# Patient Record
Sex: Female | Born: 1950 | Race: Black or African American | Hispanic: No | Smoking: Former smoker
Health system: Southern US, Community
[De-identification: ages and names within clinical notes are randomized; demographics above are authoritative.]

## PROBLEM LIST (undated history)

## (undated) DIAGNOSIS — E785 Hyperlipidemia, unspecified: Secondary | ICD-10-CM

## (undated) DIAGNOSIS — M1990 Unspecified osteoarthritis, unspecified site: Secondary | ICD-10-CM

## (undated) DIAGNOSIS — E119 Type 2 diabetes mellitus without complications: Secondary | ICD-10-CM

## (undated) DIAGNOSIS — I10 Essential (primary) hypertension: Secondary | ICD-10-CM

## (undated) DIAGNOSIS — Z9109 Other allergy status, other than to drugs and biological substances: Secondary | ICD-10-CM

## (undated) HISTORY — PX: TONSILLECTOMY: SUR1361

## (undated) HISTORY — PX: TUBAL LIGATION: SHX77

---

## 1978-05-23 HISTORY — PX: OTHER SURGICAL HISTORY: SHX169

## 2002-05-23 HISTORY — PX: HAND SURGERY: SHX662

## 2008-01-10 ENCOUNTER — Encounter: Admission: RE | Admit: 2008-01-10 | Discharge: 2008-01-10 | Payer: Self-pay | Admitting: Occupational Medicine

## 2009-06-04 ENCOUNTER — Emergency Department (HOSPITAL_BASED_OUTPATIENT_CLINIC_OR_DEPARTMENT_OTHER): Admission: EM | Admit: 2009-06-04 | Discharge: 2009-06-05 | Payer: Self-pay | Admitting: Emergency Medicine

## 2009-06-23 ENCOUNTER — Emergency Department (HOSPITAL_BASED_OUTPATIENT_CLINIC_OR_DEPARTMENT_OTHER): Admission: EM | Admit: 2009-06-23 | Discharge: 2009-06-24 | Payer: Self-pay | Admitting: Emergency Medicine

## 2009-06-23 ENCOUNTER — Ambulatory Visit: Payer: Self-pay | Admitting: Diagnostic Radiology

## 2009-12-23 ENCOUNTER — Emergency Department (HOSPITAL_BASED_OUTPATIENT_CLINIC_OR_DEPARTMENT_OTHER): Admission: EM | Admit: 2009-12-23 | Discharge: 2009-12-23 | Payer: Self-pay | Admitting: Emergency Medicine

## 2009-12-23 ENCOUNTER — Ambulatory Visit: Payer: Self-pay | Admitting: Diagnostic Radiology

## 2010-05-17 ENCOUNTER — Emergency Department (HOSPITAL_BASED_OUTPATIENT_CLINIC_OR_DEPARTMENT_OTHER)
Admission: EM | Admit: 2010-05-17 | Discharge: 2010-05-17 | Payer: Self-pay | Source: Home / Self Care | Admitting: Emergency Medicine

## 2010-06-13 ENCOUNTER — Emergency Department (HOSPITAL_BASED_OUTPATIENT_CLINIC_OR_DEPARTMENT_OTHER)
Admission: EM | Admit: 2010-06-13 | Discharge: 2010-06-13 | Payer: Self-pay | Source: Home / Self Care | Admitting: Emergency Medicine

## 2010-10-16 ENCOUNTER — Emergency Department (INDEPENDENT_AMBULATORY_CARE_PROVIDER_SITE_OTHER): Payer: Worker's Compensation

## 2010-10-16 ENCOUNTER — Emergency Department (HOSPITAL_BASED_OUTPATIENT_CLINIC_OR_DEPARTMENT_OTHER)
Admission: EM | Admit: 2010-10-16 | Discharge: 2010-10-16 | Disposition: A | Discharge status: Home / Self Care | Payer: Worker's Compensation | Attending: Emergency Medicine | Admitting: Emergency Medicine

## 2010-10-16 DIAGNOSIS — I10 Essential (primary) hypertension: Secondary | ICD-10-CM | POA: Insufficient documentation

## 2010-10-16 DIAGNOSIS — S5000XA Contusion of unspecified elbow, initial encounter: Secondary | ICD-10-CM

## 2010-10-16 DIAGNOSIS — W208XXA Other cause of strike by thrown, projected or falling object, initial encounter: Secondary | ICD-10-CM

## 2010-10-16 DIAGNOSIS — Y9289 Other specified places as the place of occurrence of the external cause: Secondary | ICD-10-CM | POA: Insufficient documentation

## 2010-10-16 DIAGNOSIS — Y9269 Other specified industrial and construction area as the place of occurrence of the external cause: Secondary | ICD-10-CM

## 2010-10-24 ENCOUNTER — Emergency Department (HOSPITAL_BASED_OUTPATIENT_CLINIC_OR_DEPARTMENT_OTHER)
Admission: EM | Admit: 2010-10-24 | Discharge: 2010-10-25 | Disposition: A | Discharge status: Home / Self Care | Payer: Worker's Compensation | Attending: Emergency Medicine | Admitting: Emergency Medicine

## 2010-10-24 DIAGNOSIS — W208XXA Other cause of strike by thrown, projected or falling object, initial encounter: Secondary | ICD-10-CM | POA: Insufficient documentation

## 2010-10-24 DIAGNOSIS — I10 Essential (primary) hypertension: Secondary | ICD-10-CM | POA: Insufficient documentation

## 2010-10-24 DIAGNOSIS — S5000XA Contusion of unspecified elbow, initial encounter: Secondary | ICD-10-CM | POA: Insufficient documentation

## 2010-11-27 ENCOUNTER — Encounter: Payer: Self-pay | Admitting: *Deleted

## 2010-11-27 ENCOUNTER — Emergency Department (HOSPITAL_BASED_OUTPATIENT_CLINIC_OR_DEPARTMENT_OTHER)
Admission: EM | Admit: 2010-11-27 | Discharge: 2010-11-28 | Disposition: A | Discharge status: Home / Self Care | Payer: PRIVATE HEALTH INSURANCE | Attending: Emergency Medicine | Admitting: Emergency Medicine

## 2010-11-27 DIAGNOSIS — R51 Headache: Secondary | ICD-10-CM | POA: Insufficient documentation

## 2010-11-27 DIAGNOSIS — J3489 Other specified disorders of nose and nasal sinuses: Secondary | ICD-10-CM | POA: Insufficient documentation

## 2010-11-27 DIAGNOSIS — R300 Dysuria: Secondary | ICD-10-CM | POA: Insufficient documentation

## 2010-11-27 DIAGNOSIS — B9789 Other viral agents as the cause of diseases classified elsewhere: Secondary | ICD-10-CM | POA: Insufficient documentation

## 2010-11-27 HISTORY — DX: Essential (primary) hypertension: I10

## 2010-11-27 NOTE — ED Notes (Signed)
Pt reports generalized malaise and body aches as well as HA denies fever

## 2010-11-28 ENCOUNTER — Encounter (HOSPITAL_BASED_OUTPATIENT_CLINIC_OR_DEPARTMENT_OTHER): Payer: Self-pay | Admitting: Emergency Medicine

## 2010-11-28 LAB — CBC
Hemoglobin: 12 g/dL (ref 12.0–15.0)
MCH: 26.5 pg (ref 26.0–34.0)
MCV: 79.7 fL (ref 78.0–100.0)
RBC: 4.53 MIL/uL (ref 3.87–5.11)

## 2010-11-28 LAB — URINALYSIS, ROUTINE W REFLEX MICROSCOPIC
Hgb urine dipstick: NEGATIVE
Ketones, ur: NEGATIVE mg/dL
Leukocytes, UA: NEGATIVE
Nitrite: NEGATIVE
Urobilinogen, UA: 1 mg/dL (ref 0.0–1.0)

## 2010-11-28 LAB — DIFFERENTIAL
Basophils Relative: 0 % (ref 0–1)
Eosinophils Relative: 4 % (ref 0–5)
Lymphocytes Relative: 55 % — ABNORMAL HIGH (ref 12–46)
Neutrophils Relative %: 32 % — ABNORMAL LOW (ref 43–77)

## 2010-11-28 LAB — BASIC METABOLIC PANEL
GFR calc Af Amer: >60 mL/min (ref >60)
GFR calc non Af Amer: >60 mL/min (ref >60)
Potassium: 3.3 mEq/L — ABNORMAL LOW (ref 3.5–5.1)

## 2010-11-28 NOTE — ED Provider Notes (Signed)
History     Chief Complaint  Patient presents with  . Headache  . Generalized Body Aches   Patient is a 60 y.o. female presenting with flu symptoms. The history is provided by the patient.  Influenza Episode onset: 4 days ago. The problem has not changed since onset.Associated symptoms include headaches. Pertinent negatives include no chest pain, no abdominal pain and no shortness of breath. The symptoms are aggravated by nothing. The symptoms are relieved by nothing.    Past Medical History  Diagnosis Date  . Hypertension     Past Surgical History  Procedure Date  . Tonsillectomy   . Hand surgery 2004    History reviewed. No pertinent family history.  History  Substance Use Topics  . Smoking status: Current Everyday Smoker    Types: Cigarettes  . Smokeless tobacco: Not on file  . Alcohol Use: No    OB History    Grav Para Term Preterm Abortions TAB SAB Ect Mult Living                  Review of Systems  Constitutional:       Body aches  HENT: Positive for ear pain, congestion, rhinorrhea and sneezing. Negative for neck pain and neck stiffness.   Respiratory: Negative for cough and shortness of breath.   Cardiovascular: Negative for chest pain.  Gastrointestinal: Negative for abdominal pain.  Genitourinary: Positive for dysuria.  Neurological: Positive for headaches.  All other systems reviewed and are negative.    Physical Exam  BP 161/64  Pulse 52  Temp(Src) 97.8 F (36.6 C) (Oral)  Resp 20  Ht 5\' 2"  (1.575 m)  Wt 138 lb (62.596 kg)  BMI 25.24 kg/m2  SpO2 98%  Physical Exam  Constitutional: She appears well-developed and well-nourished.  HENT:  Head: Normocephalic and atraumatic.  Right Ear: Tympanic membrane, external ear and ear canal normal.  Left Ear: Tympanic membrane, external ear and ear canal normal.  Nose: No rhinorrhea.  Mouth/Throat: Uvula is midline and mucous membranes are normal. No oropharyngeal exudate or posterior oropharyngeal  erythema.  Eyes: Conjunctivae and EOM are normal. Pupils are equal, round, and reactive to light.  Neck: Normal range of motion. Neck supple.  Cardiovascular: Normal rate and regular rhythm.   Pulmonary/Chest: Effort normal and breath sounds normal.  Abdominal: Soft. Bowel sounds are normal. She exhibits no distension.  Musculoskeletal: Normal range of motion.  Neurological: She is alert. Coordination normal.  Skin: Skin is warm and dry.    ED Course  Procedures  MDM No lab abnormalities. S/S C/W viral illnesss.       Hanley Seamen, MD 11/28/10 [phone]

## 2011-02-16 ENCOUNTER — Emergency Department (INDEPENDENT_AMBULATORY_CARE_PROVIDER_SITE_OTHER): Payer: PRIVATE HEALTH INSURANCE

## 2011-02-16 ENCOUNTER — Encounter (HOSPITAL_BASED_OUTPATIENT_CLINIC_OR_DEPARTMENT_OTHER): Payer: Self-pay | Admitting: *Deleted

## 2011-02-16 ENCOUNTER — Emergency Department (HOSPITAL_BASED_OUTPATIENT_CLINIC_OR_DEPARTMENT_OTHER)
Admission: EM | Admit: 2011-02-16 | Discharge: 2011-02-16 | Disposition: A | Discharge status: Home / Self Care | Payer: PRIVATE HEALTH INSURANCE | Attending: Emergency Medicine | Admitting: Emergency Medicine

## 2011-02-16 DIAGNOSIS — M79606 Pain in leg, unspecified: Secondary | ICD-10-CM

## 2011-02-16 DIAGNOSIS — M79609 Pain in unspecified limb: Secondary | ICD-10-CM

## 2011-02-16 DIAGNOSIS — I10 Essential (primary) hypertension: Secondary | ICD-10-CM | POA: Insufficient documentation

## 2011-02-16 DIAGNOSIS — J45909 Unspecified asthma, uncomplicated: Secondary | ICD-10-CM | POA: Insufficient documentation

## 2011-02-16 DIAGNOSIS — M25569 Pain in unspecified knee: Secondary | ICD-10-CM

## 2011-02-16 HISTORY — DX: Other allergy status, other than to drugs and biological substances: Z91.09

## 2011-02-16 MED ORDER — TRAMADOL HCL 50 MG PO TABS: 50.0000 mg | ORAL_TABLET | Freq: Four times a day (QID) | ORAL | Status: AC | PRN

## 2011-02-16 NOTE — ED Provider Notes (Signed)
Medical screening examination/treatment/procedure(s) were performed by non-physician practitioner and as supervising physician I was immediately available for consultation/collaboration.   Mckaylie Vasey A Angeliz Settlemyre, MD 02/16/11 1456 

## 2011-02-16 NOTE — ED Notes (Signed)
Patient states she got off of work this morning at 7am and developed a sudden onset of pain behind right knee.  States now pain is radiating into her right calf.  Denies injury.  Describes pain as throbbing pain.

## 2011-02-16 NOTE — ED Provider Notes (Signed)
History     CSN: [id number] Arrival date & time: 02/16/2011  1:18 PM  Chief Complaint  Patient presents with  . Leg Pain    right posterior knee pain radiating into right calf    (Consider location/radiation/quality/duration/timing/severity/associated sxs/prior treatment) Patient is a 60 y.o. female presenting with leg pain. The history is provided by the patient. No language interpreter was used.  Leg Pain  The incident occurred 6 to 12 hours ago. The incident occurred at work. There was no injury mechanism. Pain location: right calf. The quality of the pain is described as aching. The pain is moderate. The pain has been constant since onset. Pertinent negatives include no numbness, no inability to bear weight, no loss of motion, no muscle weakness, no loss of sensation and no tingling. She reports no foreign bodies present. The symptoms are aggravated by activity, bearing weight and palpation. She has tried nothing for the symptoms.    Past Medical History  Diagnosis Date  . Hypertension   . Asthma   . Environmental allergies     Past Surgical History  Procedure Date  . Tonsillectomy   . Hand surgery 2004  . Cysts removal 1980    navel  . Tubal ligation     No family history on file.  History  Substance Use Topics  . Smoking status: Current Everyday Smoker    Types: Cigarettes  . Smokeless tobacco: Not on file  . Alcohol Use: No    OB History    Grav Para Term Preterm Abortions TAB SAB Ect Mult Living                  Review of Systems  Respiratory: Negative for shortness of breath.   Neurological: Negative for tingling and numbness.    Allergies  Augmentin  Home Medications   Current Outpatient Rx  Name Route Sig Dispense Refill  . LORATADINE 10 MG PO TABS Oral Take 10 mg by mouth daily.      Marland Kitchen HYDROCHLOROTHIAZIDE 25 MG PO TABS Oral Take 25 mg by mouth daily.      . MELOXICAM 15 MG PO TABS Oral Take 15 mg by mouth daily.      . TRAMADOL HCL 100 MG PO  TB24 Oral Take 100 mg by mouth daily.        BP 157/74  Pulse 59  Temp(Src) 98.1 F (36.7 C) (Oral)  Resp 18  Ht 5\' 1"  (1.549 m)  Wt 135 lb (61.236 kg)  BMI 25.51 kg/m2  SpO2 100%  Physical Exam  Nursing note and vitals reviewed. Constitutional: She is oriented to person, place, and time. She appears well-developed and well-nourished.  HENT:  Head: Normocephalic.  Neck: Normal range of motion. Neck supple.  Cardiovascular: Normal rate and regular rhythm.   Pulmonary/Chest: Effort normal and breath sounds normal.  Musculoskeletal: She exhibits tenderness.       Pt tender to palpation in the right calf and posterior knee:pt has full rom:no obvious swelling or redness noted to the area  Neurological: She is alert and oriented to person, place, and time.  Skin: Skin is warm.  Psychiatric: She has a normal mood and affect.    ED Course  Procedures (including critical care time)  Labs Reviewed - No data to display US Venous Img Lower Unilateral Right  02/16/2011  *RADIOLOGY REPORT*  Clinical Data: Right knee pain radiating to calf, evaluate for DVT  RIGHT LOWER EXTREMITY VENOUS DUPLEX ULTRASOUND  Technique:  Gray-scale sonography  with graded compression, as well as color Doppler and duplex ultrasound, were performed to evaluate the deep venous system of the lower extremity from the level of the common femoral vein through the popliteal and proximal calf veins. Spectral Doppler was utilized to evaluate flow at rest and with distal augmentation maneuvers.  Comparison:  None.  Findings: The visualized right lower extremity deep venous system appears patent.  Normal compressibility.  Patent color Doppler flow.  Satisfactory spectral Doppler with respiratory variation and response to augmentation.  The greater saphenous vein, where visualized, is patent and compressible.  IMPRESSION: No deep venous thrombosis in the visualized right lower extremity.  Original Report Authenticated By: Charline Bills, M.D.     No diagnosis found.    MDM  Will treat symptomatically:no mechanical injury:no need for further imaging needed at this time       Teressa Lower, NP 02/16/11 1444

## 2011-09-17 ENCOUNTER — Encounter (HOSPITAL_BASED_OUTPATIENT_CLINIC_OR_DEPARTMENT_OTHER): Payer: Self-pay | Admitting: Emergency Medicine

## 2011-09-17 ENCOUNTER — Emergency Department (HOSPITAL_BASED_OUTPATIENT_CLINIC_OR_DEPARTMENT_OTHER)
Admission: EM | Admit: 2011-09-17 | Discharge: 2011-09-17 | Disposition: A | Discharge status: Home / Self Care | Payer: PRIVATE HEALTH INSURANCE | Attending: Emergency Medicine | Admitting: Emergency Medicine

## 2011-09-17 DIAGNOSIS — G8929 Other chronic pain: Secondary | ICD-10-CM | POA: Insufficient documentation

## 2011-09-17 DIAGNOSIS — M79609 Pain in unspecified limb: Secondary | ICD-10-CM | POA: Insufficient documentation

## 2011-09-17 DIAGNOSIS — Z881 Allergy status to other antibiotic agents status: Secondary | ICD-10-CM | POA: Insufficient documentation

## 2011-09-17 DIAGNOSIS — F17200 Nicotine dependence, unspecified, uncomplicated: Secondary | ICD-10-CM | POA: Insufficient documentation

## 2011-09-17 MED ORDER — HYDROCODONE-ACETAMINOPHEN 5-500 MG PO TABS: 1.0000 | ORAL_TABLET | Freq: Four times a day (QID) | ORAL | Status: AC | PRN

## 2011-09-17 NOTE — ED Provider Notes (Signed)
History     CSN: [id number]  Arrival date & time 09/17/11  1359   First MD Initiated Contact with Patient 09/17/11 1426      Chief Complaint  Patient presents with  . Arm Pain  . 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 female presenting with arm pain. The history is provided by the patient. No language interpreter was used.  Arm Pain This is a chronic problem. The current episode started 1 to 4 weeks ago. The problem occurs constantly. The problem has been unchanged. The symptoms are aggravated by twisting. She has tried nothing for the symptoms.    Past Medical History  Diagnosis Date  . Hypertension   . Asthma   . Environmental allergies     Past Surgical History  Procedure Date  . Tonsillectomy   . Hand surgery 2004  . Cysts removal 1980    navel  . Tubal ligation     No family history on file.  History  Substance Use Topics  . Smoking status: Current Everyday Smoker    Types: Cigarettes  . Smokeless tobacco: Not on file  . Alcohol Use: No    OB History    Grav Para Term Preterm Abortions TAB SAB Ect Mult Living                  Review of Systems  Constitutional: Negative.   Respiratory: Negative.   Cardiovascular: Negative.   Skin: Negative.   Neurological: Negative.     Allergies  Augmentin  Home Medications   Current Outpatient Rx  Name Route Sig Dispense Refill  . HYDRALAZINE HCL 10 MG PO TABS Oral Take 10 mg by mouth 3 (three) times daily.    Marland Kitchen HYDROCHLOROTHIAZIDE 25 MG PO TABS Oral Take 25 mg by mouth daily.      Marland Kitchen LORATADINE-PSEUDOEPHEDRINE ER 5-120 MG PO TB12 Oral Take 1 tablet by mouth daily.    . MELOXICAM 15 MG PO TABS Oral Take 15 mg by mouth daily.      Marland Kitchen SIMVASTATIN  20 MG PO TABS Oral Take 20 mg by mouth at bedtime.    . TRAMADOL HCL ER 100 MG PO TB24 Oral Take 100 mg by mouth daily.      Marland Kitchen HYDROCODONE-ACETAMINOPHEN 5-500 MG PO TABS Oral Take 1-2 tablets by mouth every 6 (six) hours as needed for pain. 15 tablet 0    BP 165/74  Pulse 65  Temp(Src) 97.9 F (36.6 C) (Oral)  Resp 22  SpO2 99%  Physical Exam  Nursing note and vitals reviewed. Constitutional: She is oriented to person, place, and time. She appears well-developed and well-nourished.  HENT:  Head: Normocephalic and atraumatic.  Eyes: Conjunctivae and EOM are normal.  Neck: Neck supple.  Cardiovascular: Normal rate and regular rhythm.   Pulmonary/Chest: Effort normal and breath sounds normal.  Musculoskeletal:       Pt tender on the right lateral neck  Neurological: She is alert and oriented to person, place, and time. She exhibits normal muscle tone. Coordination normal.  Skin: Skin is warm and dry.  Psychiatric: She has a normal mood and affect.    ED Course  Procedures (including critical care time)  Labs Reviewed - No data to display No  results found.   1. Chronic pain       MDM  Pt given something stronger for pain don't think imaging is needed at this time        Teressa Lower, NP 09/17/11 1503

## 2011-09-17 NOTE — ED Provider Notes (Signed)
Medical screening examination/treatment/procedure(s) were performed by non-physician practitioner and as supervising physician I was immediately available for consultation/collaboration.  Doug Sou, MD 09/17/11 [phone]

## 2011-09-17 NOTE — Discharge Instructions (Signed)

## 2011-09-17 NOTE — ED Notes (Signed)
Pt states she is having pain in left hand, arm and shoulder and upper back for over 2 weeks.  Worsening lately.  Pt states she has a pinched nerve and a cyst.

## 2012-10-20 ENCOUNTER — Emergency Department (HOSPITAL_BASED_OUTPATIENT_CLINIC_OR_DEPARTMENT_OTHER)
Admission: EM | Admit: 2012-10-20 | Discharge: 2012-10-20 | Disposition: A | Discharge status: Home / Self Care | Payer: No Typology Code available for payment source | Attending: Emergency Medicine | Admitting: Emergency Medicine

## 2012-10-20 ENCOUNTER — Encounter (HOSPITAL_BASED_OUTPATIENT_CLINIC_OR_DEPARTMENT_OTHER): Payer: Self-pay | Admitting: *Deleted

## 2012-10-20 DIAGNOSIS — J45909 Unspecified asthma, uncomplicated: Secondary | ICD-10-CM | POA: Insufficient documentation

## 2012-10-20 DIAGNOSIS — Z880 Allergy status to penicillin: Secondary | ICD-10-CM | POA: Insufficient documentation

## 2012-10-20 DIAGNOSIS — R51 Headache: Secondary | ICD-10-CM | POA: Insufficient documentation

## 2012-10-20 DIAGNOSIS — I10 Essential (primary) hypertension: Secondary | ICD-10-CM | POA: Insufficient documentation

## 2012-10-20 DIAGNOSIS — Z79899 Other long term (current) drug therapy: Secondary | ICD-10-CM | POA: Insufficient documentation

## 2012-10-20 DIAGNOSIS — F17200 Nicotine dependence, unspecified, uncomplicated: Secondary | ICD-10-CM | POA: Insufficient documentation

## 2012-10-20 DIAGNOSIS — R05 Cough: Secondary | ICD-10-CM | POA: Insufficient documentation

## 2012-10-20 DIAGNOSIS — R059 Cough, unspecified: Secondary | ICD-10-CM | POA: Insufficient documentation

## 2012-10-20 DIAGNOSIS — J329 Chronic sinusitis, unspecified: Secondary | ICD-10-CM | POA: Insufficient documentation

## 2012-10-20 DIAGNOSIS — R6889 Other general symptoms and signs: Secondary | ICD-10-CM | POA: Insufficient documentation

## 2012-10-20 MED ORDER — LORATADINE 10 MG PO TABS: 10.0000 mg | ORAL_TABLET | Freq: Every day | ORAL | Status: DC

## 2012-10-20 MED ORDER — AZITHROMYCIN 250 MG PO TABS: 250.0000 mg | ORAL_TABLET | Freq: Every day | ORAL | Status: DC

## 2012-10-20 MED ORDER — HYDROCODONE-ACETAMINOPHEN 5-325 MG PO TABS: 2.0000 | ORAL_TABLET | ORAL | Status: DC | PRN

## 2012-10-20 NOTE — ED Notes (Signed)
C/o sneezing, runny nose, cough, and congestion for past 2 days with a frontal h/a. Denies any fevers. States prod cough clear in color.

## 2012-10-20 NOTE — ED Provider Notes (Signed)
History  This chart was scribed for Geoffery Lyons, MD by Ardelia Mems, ED Scribe. This patient was seen in room MH12/[facility]12 and the patient's care was started at 9:22 PM.   CSN: [id number]  Arrival date & time 10/20/12  1907    Chief Complaint  Patient presents with  . Allergies     The history is provided by the patient. No language interpreter was used.    HPI Comments: Kathy Branch is a 62 y.o. female with a h/o of asthma and environmental allergies who presents to the Emergency Department complaining of 2 days of constant, moderate allergy symptoms. Pt reports having nasal congestion ,rhinorrhea, sneezing, a frontal headache and a productive cough of clear mucus for the past 2 days. Pt states that she has taken Claritin without relief. Pt states that an antibiotic and something for pain will usually get her through her discomfort. Pt is a current every day smoker.   Past Medical History  Diagnosis Date  . Hypertension   . Asthma   . Environmental allergies     Past Surgical History  Procedure Laterality Date  . Tonsillectomy    . Hand surgery  2004  . Cysts removal  1980    navel  . Tubal ligation      No family history on file.  History  Substance Use Topics  . Smoking status: Current Every Day Smoker    Types: Cigarettes  . Smokeless tobacco: Not on file  . Alcohol Use: No    OB History   Grav Para Term Preterm Abortions TAB SAB Ect Mult Living                  Review of Systems  Constitutional: Negative for fever and chills.  HENT: Positive for congestion, rhinorrhea and sneezing.   Respiratory: Positive for cough.   Cardiovascular: Negative for chest pain.  Gastrointestinal: Negative for nausea, vomiting and abdominal pain.  Neurological: Positive for headaches.  All other systems reviewed and are negative.    Allergies  Amoxicillin-pot clavulanate  Home Medications   Current Outpatient Rx  Name  Route  Sig  Dispense  Refill  .  ALBUTEROL IN   Inhalation   Inhale into the lungs.         . hydrALAZINE (APRESOLINE) 10 MG tablet   Oral   Take 25 mg by mouth 3 (three) times daily.          . hydrochlorothiazide 25 MG tablet   Oral   Take 25 mg by mouth daily.           Marland Kitchen loratadine-pseudoephedrine (CLARITIN-D 12-HOUR) 5-120 MG per tablet   Oral   Take 1 tablet by mouth daily.         . meloxicam (MOBIC) 15 MG tablet   Oral   Take 15 mg by mouth daily.           . simvastatin (ZOCOR) 20 MG tablet   Oral   Take 20 mg by mouth at bedtime.         . traMADol (ULTRAM-ER) 100 MG 24 hr tablet   Oral   Take 100 mg by mouth daily.             Triage Vitals: BP 158/78  Pulse 65  Temp(Src) 97.8 F (36.6 C) (Oral)  Resp 18  Ht 5\' 2"  (1.575 m)  Wt 151 lb (68.493 kg)  BMI 27.61 kg/m2  SpO2 98%  Physical Exam  Nursing note  and vitals reviewed. Constitutional: She is oriented to person, place, and time. She appears well-developed and well-nourished.  HENT:  Head: Normocephalic and atraumatic.  Maxillofacial tenderness to palpation. Oropharynx clear.  Eyes: EOM are normal. Pupils are equal, round, and reactive to light.  Neck: Normal range of motion. Neck supple. No tracheal deviation present.  Cardiovascular: Normal rate, regular rhythm and normal heart sounds.   No murmur heard. Pulmonary/Chest: Effort normal and breath sounds normal. No respiratory distress.  Abdominal: Soft. Bowel sounds are normal. There is no tenderness.  Musculoskeletal: Normal range of motion. She exhibits no tenderness.  Neurological: She is alert and oriented to person, place, and time.  Skin: Skin is warm. No rash noted.  Psychiatric: She has a normal mood and affect.    ED Course  Procedures (including critical care time)  DIAGNOSTIC STUDIES: Oxygen Saturation is 98% on RA, normal by my interpretation.    COORDINATION OF CARE: 9:27 PM- Pt advised of plan for treatment and pt agrees.     Labs Reviewed  - No data to display No results found.   No diagnosis found.    MDM  Will treat for sinusitis, allergies as she has had these issues in the past.             I personally performed the services described in this documentation, which was scribed in my presence. The recorded information has been reviewed and is accurate.      Geoffery Lyons, MD 10/25/12 [phone]

## 2012-10-21 NOTE — ED Provider Notes (Deleted)
History     CSN: [id number]  Arrival date & time 10/20/12  4098   First MD Initiated Contact with Patient 10/20/12 2122      Chief Complaint  Patient presents with  . Allergies    (Consider location/radiation/quality/duration/timing/severity/associated sxs/prior treatment) HPI  Past Medical History  Diagnosis Date  . Hypertension   . Asthma   . Environmental allergies     Past Surgical History  Procedure Laterality Date  . Tonsillectomy    . Hand surgery  2004  . Cysts removal  1980    navel  . Tubal ligation      No family history on file.  History  Substance Use Topics  . Smoking status: Current Every Day Smoker    Types: Cigarettes  . Smokeless tobacco: Not on file  . Alcohol Use: No    OB History   Grav Para Term Preterm Abortions TAB SAB Ect Mult Living                  Review of Systems  Allergies  Amoxicillin-pot clavulanate  Home Medications   Current Outpatient Rx  Name  Route  Sig  Dispense  Refill  . ALBUTEROL IN   Inhalation   Inhale into the lungs.         Marland Kitchen azithromycin (ZITHROMAX) 250 MG tablet   Oral   Take 1 tablet (250 mg total) by mouth daily. Take first 2 tablets together, then 1 every day until finished.   6 tablet   0   . hydrALAZINE (APRESOLINE) 10 MG tablet   Oral   Take 25 mg by mouth 3 (three) times daily.          . hydrochlorothiazide 25 MG tablet   Oral   Take 25 mg by mouth daily.           Marland Kitchen HYDROcodone-acetaminophen (NORCO) 5-325 MG per tablet   Oral   Take 2 tablets by mouth every 4 (four) hours as needed for pain.   15 tablet   0   . loratadine (CLARITIN) 10 MG tablet   Oral   Take 1 tablet (10 mg total) by mouth daily.   15 tablet   1   . loratadine-pseudoephedrine (CLARITIN-D 12-HOUR) 5-120 MG per tablet   Oral   Take 1 tablet by mouth daily.         . meloxicam (MOBIC) 15 MG tablet   Oral   Take 15 mg by mouth daily.           . simvastatin (ZOCOR) 20 MG tablet   Oral  Take 20 mg by mouth at bedtime.         . traMADol (ULTRAM-ER) 100 MG 24 hr tablet   Oral   Take 100 mg by mouth daily.             BP 158/78  Pulse 65  Temp(Src) 97.8 F (36.6 C) (Oral)  Resp 18  Ht 5\' 2"  (1.575 m)  Wt 151 lb (68.493 kg)  BMI 27.61 kg/m2  SpO2 98%  Physical Exam  ED Course  Procedures (including critical care time)  Labs Reviewed - No data to display No results found.   1. Sinusitis       MDM  Will treat for allergies, sinusitis.  Return prn.        Geoffery Lyons, MD 10/21/12 [phone]

## 2012-11-13 ENCOUNTER — Telehealth (HOSPITAL_COMMUNITY): Payer: Self-pay | Admitting: Emergency Medicine

## 2012-11-13 NOTE — ED Notes (Addendum)
Pharmacy calling, pt trying to fill Rx for Hydrocone written 10/20/12.  Pharmacy informed beyond 2 wk window don't fill.

## 2013-01-24 ENCOUNTER — Emergency Department (HOSPITAL_BASED_OUTPATIENT_CLINIC_OR_DEPARTMENT_OTHER)
Admission: EM | Admit: 2013-01-24 | Discharge: 2013-01-24 | Disposition: A | Discharge status: Home / Self Care | Payer: No Typology Code available for payment source | Attending: Emergency Medicine | Admitting: Emergency Medicine

## 2013-01-24 ENCOUNTER — Encounter (HOSPITAL_BASED_OUTPATIENT_CLINIC_OR_DEPARTMENT_OTHER): Payer: Self-pay | Admitting: *Deleted

## 2013-01-24 DIAGNOSIS — Z880 Allergy status to penicillin: Secondary | ICD-10-CM | POA: Insufficient documentation

## 2013-01-24 DIAGNOSIS — Y929 Unspecified place or not applicable: Secondary | ICD-10-CM | POA: Insufficient documentation

## 2013-01-24 DIAGNOSIS — I10 Essential (primary) hypertension: Secondary | ICD-10-CM | POA: Insufficient documentation

## 2013-01-24 DIAGNOSIS — F17200 Nicotine dependence, unspecified, uncomplicated: Secondary | ICD-10-CM | POA: Insufficient documentation

## 2013-01-24 DIAGNOSIS — Y939 Activity, unspecified: Secondary | ICD-10-CM | POA: Insufficient documentation

## 2013-01-24 DIAGNOSIS — Z79899 Other long term (current) drug therapy: Secondary | ICD-10-CM | POA: Insufficient documentation

## 2013-01-24 DIAGNOSIS — M129 Arthropathy, unspecified: Secondary | ICD-10-CM | POA: Insufficient documentation

## 2013-01-24 DIAGNOSIS — L0201 Cutaneous abscess of face: Secondary | ICD-10-CM | POA: Insufficient documentation

## 2013-01-24 DIAGNOSIS — Z9109 Other allergy status, other than to drugs and biological substances: Secondary | ICD-10-CM | POA: Insufficient documentation

## 2013-01-24 DIAGNOSIS — J45909 Unspecified asthma, uncomplicated: Secondary | ICD-10-CM | POA: Insufficient documentation

## 2013-01-24 DIAGNOSIS — L03211 Cellulitis of face: Secondary | ICD-10-CM | POA: Insufficient documentation

## 2013-01-24 DIAGNOSIS — L089 Local infection of the skin and subcutaneous tissue, unspecified: Secondary | ICD-10-CM | POA: Insufficient documentation

## 2013-01-24 HISTORY — DX: Unspecified osteoarthritis, unspecified site: M19.90

## 2013-01-24 MED ORDER — SULFAMETHOXAZOLE-TRIMETHOPRIM 800-160 MG PO TABS: 1.0000 | ORAL_TABLET | Freq: Two times a day (BID) | ORAL | Status: AC

## 2013-01-24 MED ORDER — LIDOCAINE HCL (PF) 1 % IJ SOLN
INTRAMUSCULAR | Status: AC
  Administered 2013-01-24: 5 mL
  Filled 2013-01-24: qty 5

## 2013-01-24 MED ORDER — CEFTRIAXONE SODIUM 1 G IJ SOLR
1.0000 g | Freq: Once | INTRAMUSCULAR | Status: AC
  Administered 2013-01-24: 1 g via INTRAMUSCULAR
  Filled 2013-01-24: qty 10

## 2013-01-24 MED ORDER — LIDOCAINE HCL (PF) 1 % IJ SOLN
INTRAMUSCULAR | Status: AC
  Administered 2013-01-24: 2.1 mL
  Filled 2013-01-24: qty 5

## 2013-01-24 MED ORDER — HYDROCODONE-ACETAMINOPHEN 5-325 MG PO TABS: 2.0000 | ORAL_TABLET | ORAL | Status: DC | PRN

## 2013-01-24 NOTE — ED Notes (Signed)
Langston Masker, pa assisted with I&D of abcess on pt chin. Pt tolerated well, very little drainage obtained. dsd applied to pt chin.

## 2013-01-24 NOTE — ED Provider Notes (Signed)
CSN: [id number]     Arrival date & time 01/24/13  1304 History   First MD Initiated Contact with Patient 01/24/13 1336     Chief Complaint  Patient presents with  . Insect Bite    chin   (Consider location/radiation/quality/duration/timing/severity/associated sxs/prior Treatment) Patient is a 62 y.o. female presenting with facial injury. The history is provided by the patient. No language interpreter was used.  Facial Injury Mechanism of injury:  Animal bite Location:  Face Time since incident:  3 days Pain details:    Quality:  Aching   Severity:  Moderate   Duration:  3 days   Timing:  Constant   Progression:  Worsening Chronicity:  New Foreign body present:  No foreign bodies Relieved by:  Nothing Worsened by:  Heat Pt complains of swelling and redness to chin.  Pt reports increased redness and swelling  Past Medical History  Diagnosis Date  . Hypertension   . Asthma   . Environmental allergies   . Arthritis    Past Surgical History  Procedure Laterality Date  . Tonsillectomy    . Hand surgery  2004  . Cysts removal  1980    navel  . Tubal ligation     No family history on file. History  Substance Use Topics  . Smoking status: Current Every Day Smoker    Types: Cigarettes  . Smokeless tobacco: Not on file  . Alcohol Use: No   OB History   Grav Para Term Preterm Abortions TAB SAB Ect Mult Living                 Review of Systems  All other systems reviewed and are negative.    Allergies  Amoxicillin-pot clavulanate  Home Medications   Current Outpatient Rx  Name  Route  Sig  Dispense  Refill  . fish oil-omega-3 fatty acids 1000 MG capsule   Oral   Take 2 g by mouth daily.         . potassium chloride SA (K-DUR,KLOR-CON) 20 MEQ tablet   Oral   Take 20 mEq by mouth 2 (two) times daily.         . ALBUTEROL IN   Inhalation   Inhale into the lungs.         Marland Kitchen azithromycin (ZITHROMAX) 250 MG tablet   Oral   Take 1 tablet (250 mg total)  by mouth daily. Take first 2 tablets together, then 1 every day until finished.   6 tablet   0   . hydrALAZINE (APRESOLINE) 10 MG tablet   Oral   Take 25 mg by mouth 3 (three) times daily.          . hydrochlorothiazide 25 MG tablet   Oral   Take 25 mg by mouth daily.           Marland Kitchen HYDROcodone-acetaminophen (NORCO) 5-325 MG per tablet   Oral   Take 2 tablets by mouth every 4 (four) hours as needed for pain.   15 tablet   0   . HYDROcodone-acetaminophen (NORCO/VICODIN) 5-325 MG per tablet   Oral   Take 2 tablets by mouth every 4 (four) hours as needed for pain.   10 tablet   0   . loratadine (CLARITIN) 10 MG tablet   Oral   Take 1 tablet (10 mg total) by mouth daily.   15 tablet   1   . loratadine-pseudoephedrine (CLARITIN-D 12-HOUR) 5-120 MG per tablet   Oral   Take  1 tablet by mouth daily.         . meloxicam (MOBIC) 15 MG tablet   Oral   Take 15 mg by mouth daily.           . simvastatin (ZOCOR) 20 MG tablet   Oral   Take 20 mg by mouth at bedtime.         . sulfamethoxazole-trimethoprim (BACTRIM DS,SEPTRA DS) 800-160 MG per tablet   Oral   Take 1 tablet by mouth 2 (two) times daily.   14 tablet   0   . traMADol (ULTRAM-ER) 100 MG 24 hr tablet   Oral   Take 100 mg by mouth daily.            BP 156/67  Pulse 80  Temp(Src) 99.1 F (37.3 C) (Oral)  Resp 18  Ht 5' (1.524 m)  Wt 145 lb (65.772 kg)  BMI 28.32 kg/m2  SpO2 99% Physical Exam  Nursing note and vitals reviewed. Constitutional: She is oriented to person, place, and time. She appears well-developed and well-nourished.  HENT:  Head: Normocephalic.  Eyes: Pupils are equal, round, and reactive to light.  Neck: Normal range of motion.  Cardiovascular: Normal rate.   Pulmonary/Chest: Effort normal.  Abdominal: Soft.  Musculoskeletal: Normal range of motion.  Neurological: She is alert and oriented to person, place, and time. She has normal reflexes.  Skin: There is erythema.  3cm  area chin    Psychiatric: She has a normal mood and affect.    ED Course  INCISION AND DRAINAGE Date/Time: 01/24/2013 4:25 PM Performed by: Elson Areas Authorized by: Elson Areas Consent: Verbal consent not obtained. Risks and benefits: risks, benefits and alternatives were discussed Required items: required blood products, implants, devices, and special equipment available Time out: Immediately prior to procedure a "time out" was called to verify the correct patient, procedure, equipment, support staff and site/side marked as required. Type: abscess Body area: head/neck Anesthesia: local infiltration Scalpel size: 11 Incision type: single straight Complexity: simple Patient tolerance: Patient tolerated the procedure well with no immediate complications.   (including critical care time) Labs Review Labs Reviewed - No data to display Imaging Review No results found.  MDM   1. Cellulitis, face    Bactrim DS Pt given IV Rocephin,   Return here for recheck tomorrow    Elson Areas, PA-C 01/24/13 1626

## 2013-01-24 NOTE — ED Notes (Signed)
Unknown insect bite to chin for the last three days.  Moderate redness and swelling noted.

## 2013-01-24 NOTE — ED Provider Notes (Signed)
Medical screening examination/treatment/procedure(s) were performed by non-physician practitioner and as supervising physician I was immediately available for consultation/collaboration.  Derwood Kaplan, MD 01/24/13 1644

## 2013-05-19 IMAGING — CR DG ELBOW COMPLETE 3+V*R*
4 series · 4 of 4 positions shown · non-contrast
Comparison: None.

CLINICAL DATA: Fall with right elbow pain.

RIGHT ELBOW - COMPLETE 3+ VIEW

[x elbow joint ap right]
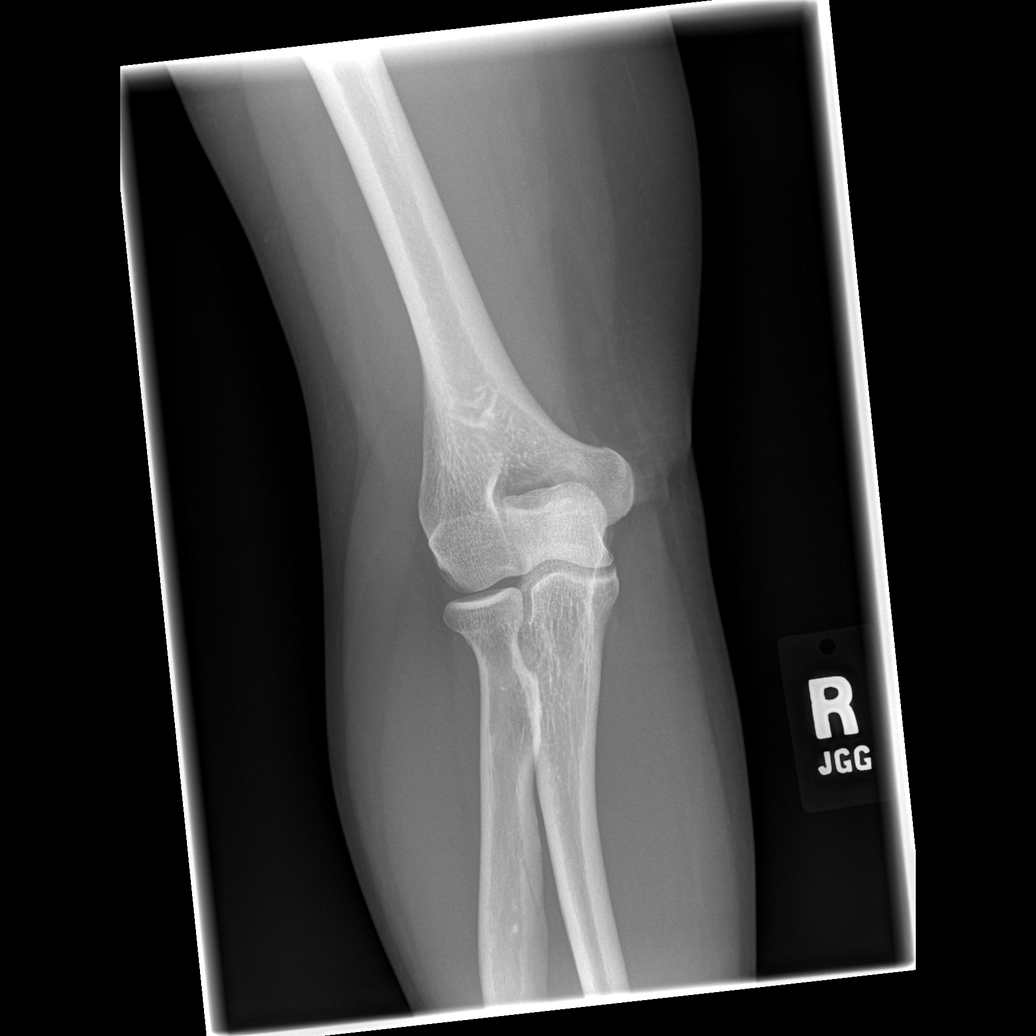

[x elbow joint obl. right (1 of 2)]
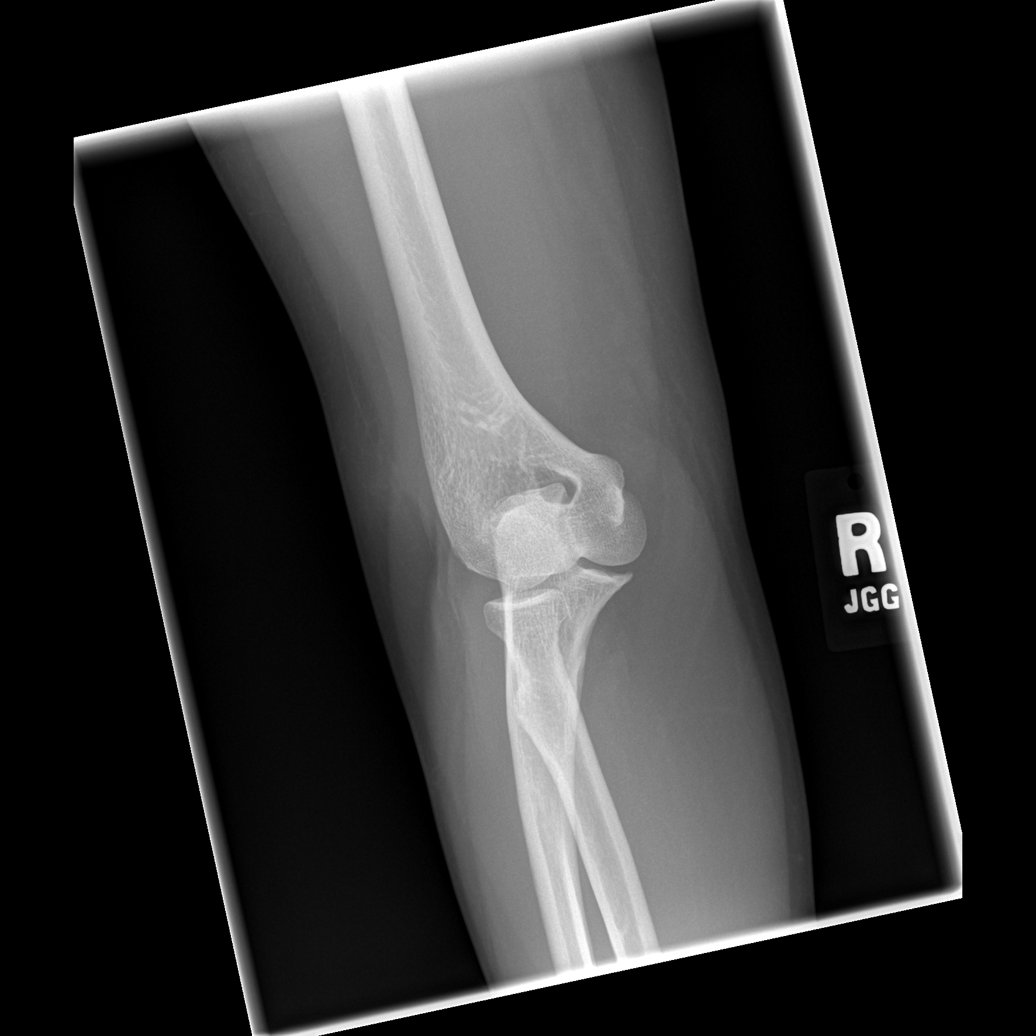

[x elbow joint obl. right (2 of 2)]
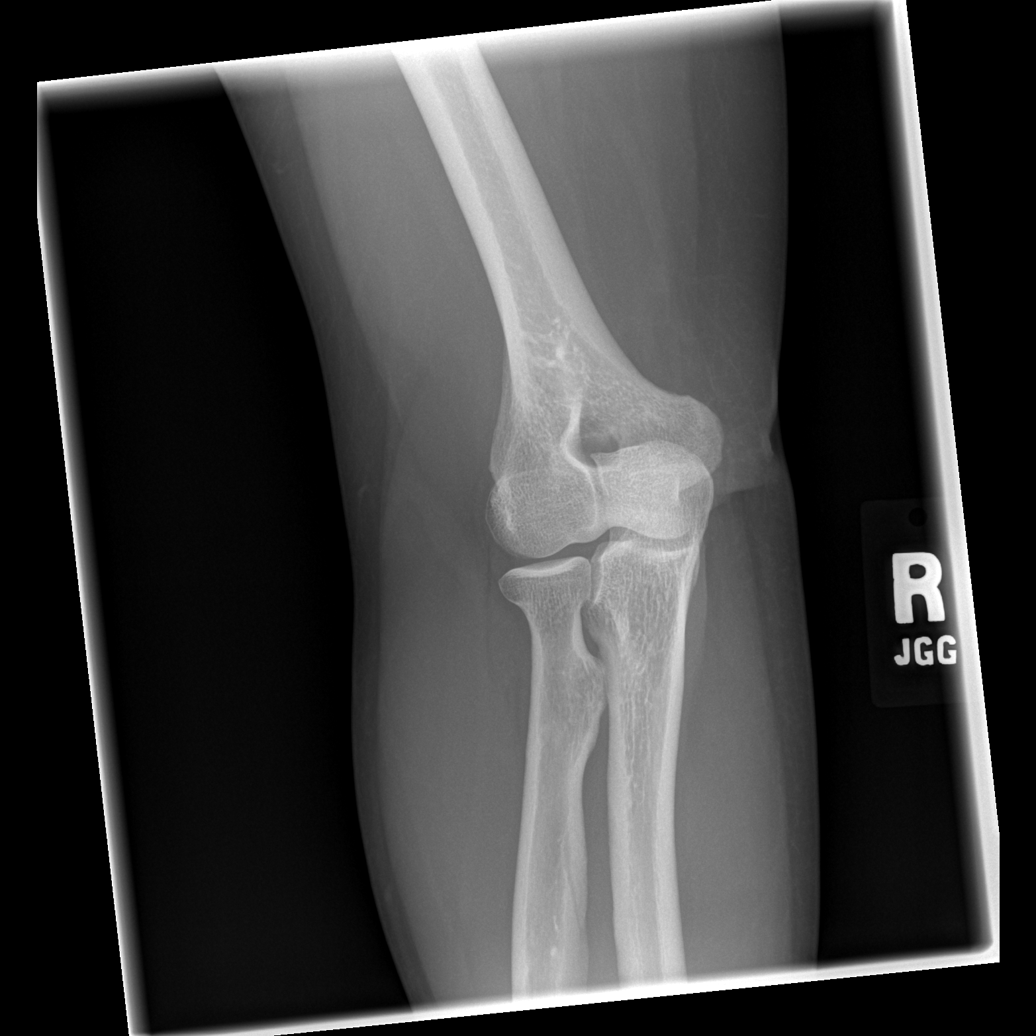

[x elbow joint lat right]
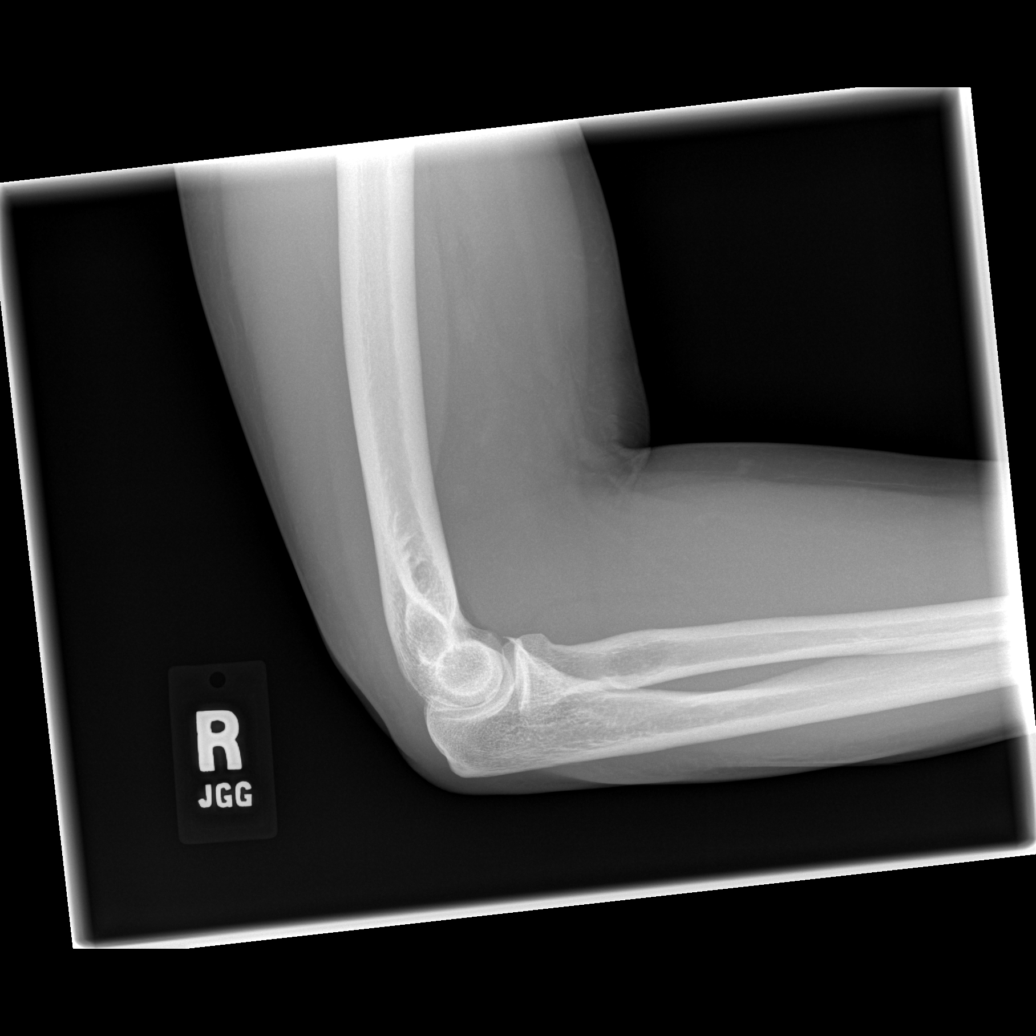

[4 of 4 positions shown; findings below may reference images not displayed]

FINDINGS: There is no evidence of fracture, dislocation, or joint
effusion.  There is no evidence of arthropathy or other focal bone
abnormality.  Soft tissues are unremarkable.
IMPRESSION: Negative.

## 2013-11-30 ENCOUNTER — Encounter (HOSPITAL_BASED_OUTPATIENT_CLINIC_OR_DEPARTMENT_OTHER): Payer: Self-pay | Admitting: Emergency Medicine

## 2013-11-30 ENCOUNTER — Emergency Department (HOSPITAL_BASED_OUTPATIENT_CLINIC_OR_DEPARTMENT_OTHER)
Admission: EM | Admit: 2013-11-30 | Discharge: 2013-12-01 | Disposition: A | Discharge status: Home / Self Care | Payer: BC Managed Care – PPO | Attending: Emergency Medicine | Admitting: Emergency Medicine

## 2013-11-30 DIAGNOSIS — R110 Nausea: Secondary | ICD-10-CM | POA: Insufficient documentation

## 2013-11-30 DIAGNOSIS — F17200 Nicotine dependence, unspecified, uncomplicated: Secondary | ICD-10-CM | POA: Insufficient documentation

## 2013-11-30 DIAGNOSIS — Z792 Long term (current) use of antibiotics: Secondary | ICD-10-CM | POA: Insufficient documentation

## 2013-11-30 DIAGNOSIS — Z8739 Personal history of other diseases of the musculoskeletal system and connective tissue: Secondary | ICD-10-CM | POA: Insufficient documentation

## 2013-11-30 DIAGNOSIS — I10 Essential (primary) hypertension: Secondary | ICD-10-CM | POA: Insufficient documentation

## 2013-11-30 DIAGNOSIS — R109 Unspecified abdominal pain: Secondary | ICD-10-CM | POA: Insufficient documentation

## 2013-11-30 DIAGNOSIS — J45901 Unspecified asthma with (acute) exacerbation: Secondary | ICD-10-CM | POA: Insufficient documentation

## 2013-11-30 DIAGNOSIS — Z791 Long term (current) use of non-steroidal anti-inflammatories (NSAID): Secondary | ICD-10-CM | POA: Insufficient documentation

## 2013-11-30 DIAGNOSIS — Z79899 Other long term (current) drug therapy: Secondary | ICD-10-CM | POA: Insufficient documentation

## 2013-11-30 DIAGNOSIS — J0100 Acute maxillary sinusitis, unspecified: Secondary | ICD-10-CM | POA: Insufficient documentation

## 2013-11-30 MED ORDER — ONDANSETRON 4 MG PO TBDP
4.0000 mg | ORAL_TABLET | Freq: Once | ORAL | Status: AC
  Administered 2013-12-01: 4 mg via ORAL
  Filled 2013-11-30: qty 1

## 2013-11-30 NOTE — ED Notes (Signed)
Pt reports nasal congestion, runny nose, sore throat, mild cough, reports that her nasal drainage is causing nausea.

## 2013-11-30 NOTE — ED Provider Notes (Signed)
CSN: [id number]     Arrival date & time 11/30/13  2323 History  This chart was scribed for Hanley SeamenJohn L Vernia Teem, MD by Phillis HaggisGabriella Gaje, ED Scribe. This patient was seen in room MH09/MH09 and patient care was started at 11:49 PM.   Chief Complaint  Patient presents with  . Nasal Congestion   The history is provided by the patient. No language interpreter was used.   HPI Comments: Kathy Branch is a 63 y.o. female with a history of asthma who presents to the Emergency Department complaining of nasal congestion onset two weeks ago. She reports that she has drainage that runs down her throat and makes her stomach hurt and causes nausea. She reports that the symptoms got worse today at work where she reports the temperature was much hotter inside than it was outside. She reports SOB, cough, itching in her throat, wheezing, nausea and otalgia. She states that she takes an albuterol inhaler. She states that she has not taken anything for her symptoms. She denies diarrhea and vomiting. 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   Past Medical History  Diagnosis Date  . Hypertension   . Asthma   . Environmental allergies   . Arthritis    Past Surgical History  Procedure Laterality Date  . Tonsillectomy    . Hand surgery  2004  . Cysts removal  1980    navel  . Tubal ligation     History reviewed. No pertinent family history. History  Substance Use Topics  . Smoking status: Current Every Day Smoker    Types: Cigarettes  . Smokeless tobacco: Not on file  . Alcohol Use: No   OB History   Grav Para Term Preterm Abortions TAB SAB Ect Mult Living                 Review of Systems  HENT: Positive for ear pain.   Respiratory: Positive for cough, shortness of breath and wheezing.   Gastrointestinal: Positive for nausea and abdominal pain. Negative for vomiting and diarrhea.  A complete 10 system review of systems was obtained and all systems are negative except as noted in the  HPI and PMH.   Allergies  Amoxicillin-pot clavulanate  Home Medications   Prior to Admission medications   Medication Sig Start Date End Date Taking? Authorizing Provider  ALBUTEROL IN Inhale into the lungs.   Yes Historical Provider, MD  amLODipine (NORVASC) 5 MG tablet Take 5 mg by mouth daily.   Yes Historical Provider, MD  hydrALAZINE (APRESOLINE) 10 MG tablet Take 25 mg by mouth 3 (three) times daily.    Yes Historical Provider, MD  hydrochlorothiazide 25 MG tablet Take 25 mg by mouth daily.     Yes Historical Provider, MD  loratadine (CLARITIN) 10 MG tablet Take 1 tablet (10 mg total) by mouth daily. 10/20/12  Yes Geoffery Lyonsouglas Delo, MD  meloxicam (MOBIC) 15 MG tablet Take 15 mg by mouth daily.     Yes Historical Provider, MD  potassium chloride SA (K-DUR,KLOR-CON) 20 MEQ tablet Take 20 mEq by mouth 2 (two) times daily.   Yes Historical Provider, MD  simvastatin (ZOCOR) 20 MG tablet Take 20 mg by mouth at bedtime.   Yes Historical Provider, MD  traMADol (ULTRAM-ER) 100 MG 24 hr tablet Take 100 mg by mouth daily.     Yes Historical Provider, MD  azithromycin (ZITHROMAX) 250 MG tablet Take 1 tablet (250 mg total) by mouth daily. Take first  2 tablets together, then 1 every day until finished. 10/20/12   Geoffery Lyons, MD  fish oil-omega-3 fatty acids 1000 MG capsule Take 2 g by mouth daily.    Historical Provider, MD  HYDROcodone-acetaminophen (NORCO) 5-325 MG per tablet Take 2 tablets by mouth every 4 (four) hours as needed for pain. 10/20/12   Geoffery Lyons, MD  HYDROcodone-acetaminophen (NORCO/VICODIN) 5-325 MG per tablet Take 2 tablets by mouth every 4 (four) hours as needed for pain. 01/24/13   Elson Areas, PA-C  loratadine-pseudoephedrine (CLARITIN-D 12-HOUR) 5-120 MG per tablet Take 1 tablet by mouth daily.    Historical Provider, MD   BP 151/66  Pulse 60  Temp(Src) 98.1 F (36.7 C) (Oral)  Resp 20  Ht 5' (1.524 m)  Wt 140 lb (63.504 kg)  BMI 27.34 kg/m2  SpO2 100% Physical  Exam General: Well-developed, well-nourished female in no acute distress; appearance consistent with age of record HENT: normocephalic; atraumatic; tenderness over percussion of maxillary sinuses; no erythema of TMs Eyes: pupils equal, round and reactive to light; extraocular muscles intact Neck: supple Heart: regular rate and rhythm Lungs: clear to auscultation bilaterally Abdomen: soft; nondistended; nontender; no masses or hepatosplenomegaly; bowel sounds present. Mild epigastric tenderness. Extremities: No deformity; full range of motion; pulses normal Neurologic: Awake, alert and oriented; motor function intact in all extremities and symmetric; no facial droop Skin: Warm and dry Psychiatric: Normal mood and affect  ED Course  Procedures (including critical care time) DIAGNOSTIC STUDIES: Oxygen Saturation is 100% on room air, normal by my interpretation.    COORDINATION OF CARE: 11:54 PM-Discussed treatment plan which includes sinus x-ray with pt at bedside and pt agreed to plan.   MDM  Nursing notes and vitals signs, including pulse oximetry, reviewed.  Summary of this visit's results, reviewed by myself:  Labs:  No results found for this or any previous visit (from the past 24 hour(s)).  Imaging Studies: Dg Sinuses Complete  12/01/2013   CLINICAL DATA:  Nasal congestion for 2 weeks.  EXAM: PARANASAL SINUSES - COMPLETE 3 + VIEW  COMPARISON:  CT head 06/23/2009  FINDINGS: There is mucosal membrane thickening suggested in the maxillary antra with possible air-fluid level versus retention cyst in the floor the right maxillary antrum. Frontal and ethmoid sinuses appear to be patent. Visualized orbital rims and facial bones are nondisplaced.  IMPRESSION: Mucosal thickening in the maxillary antra with probable air-fluid level versus retention cyst in the floor of the right maxillary antrum.   Electronically Signed   By: Burman Nieves M.D.   On: 12/01/2013 00:57   Will treat for  sinusitis.  I personally performed the services described in this documentation, which was scribed in my presence. The recorded information has been reviewed and is accurate.   Hanley Seamen, MD 12/01/13 [phone]

## 2013-12-01 ENCOUNTER — Emergency Department (HOSPITAL_BASED_OUTPATIENT_CLINIC_OR_DEPARTMENT_OTHER): Payer: BC Managed Care – PPO

## 2013-12-01 MED ORDER — DOXYCYCLINE HYCLATE 100 MG PO CAPS: 100.0000 mg | ORAL_CAPSULE | Freq: Two times a day (BID) | ORAL | Status: DC

## 2013-12-01 MED ORDER — DOXYCYCLINE HYCLATE 100 MG PO TABS
100.0000 mg | ORAL_TABLET | Freq: Once | ORAL | Status: AC
  Administered 2013-12-01: 100 mg via ORAL
  Filled 2013-12-01: qty 1

## 2013-12-01 MED ORDER — FLUTICASONE PROPIONATE 50 MCG/ACT NA SUSP: 2.0000 | Freq: Every day | NASAL | Status: DC

## 2014-02-10 ENCOUNTER — Emergency Department (HOSPITAL_BASED_OUTPATIENT_CLINIC_OR_DEPARTMENT_OTHER)
Admission: EM | Admit: 2014-02-10 | Discharge: 2014-02-10 | Disposition: A | Discharge status: Home / Self Care | Payer: BC Managed Care – PPO | Attending: Emergency Medicine | Admitting: Emergency Medicine

## 2014-02-10 ENCOUNTER — Emergency Department (HOSPITAL_BASED_OUTPATIENT_CLINIC_OR_DEPARTMENT_OTHER): Payer: BC Managed Care – PPO

## 2014-02-10 ENCOUNTER — Encounter (HOSPITAL_BASED_OUTPATIENT_CLINIC_OR_DEPARTMENT_OTHER): Payer: Self-pay | Admitting: Emergency Medicine

## 2014-02-10 DIAGNOSIS — F17200 Nicotine dependence, unspecified, uncomplicated: Secondary | ICD-10-CM | POA: Diagnosis not present

## 2014-02-10 DIAGNOSIS — Y9389 Activity, other specified: Secondary | ICD-10-CM | POA: Insufficient documentation

## 2014-02-10 DIAGNOSIS — I10 Essential (primary) hypertension: Secondary | ICD-10-CM | POA: Insufficient documentation

## 2014-02-10 DIAGNOSIS — Z880 Allergy status to penicillin: Secondary | ICD-10-CM | POA: Insufficient documentation

## 2014-02-10 DIAGNOSIS — W010XXA Fall on same level from slipping, tripping and stumbling without subsequent striking against object, initial encounter: Secondary | ICD-10-CM | POA: Diagnosis not present

## 2014-02-10 DIAGNOSIS — Y9229 Other specified public building as the place of occurrence of the external cause: Secondary | ICD-10-CM | POA: Insufficient documentation

## 2014-02-10 DIAGNOSIS — T148XXA Other injury of unspecified body region, initial encounter: Secondary | ICD-10-CM

## 2014-02-10 DIAGNOSIS — Z792 Long term (current) use of antibiotics: Secondary | ICD-10-CM | POA: Diagnosis not present

## 2014-02-10 DIAGNOSIS — Z79899 Other long term (current) drug therapy: Secondary | ICD-10-CM | POA: Diagnosis not present

## 2014-02-10 DIAGNOSIS — Z791 Long term (current) use of non-steroidal anti-inflammatories (NSAID): Secondary | ICD-10-CM | POA: Diagnosis not present

## 2014-02-10 DIAGNOSIS — S79929A Unspecified injury of unspecified thigh, initial encounter: Secondary | ICD-10-CM

## 2014-02-10 DIAGNOSIS — S7010XA Contusion of unspecified thigh, initial encounter: Secondary | ICD-10-CM | POA: Insufficient documentation

## 2014-02-10 DIAGNOSIS — S79919A Unspecified injury of unspecified hip, initial encounter: Secondary | ICD-10-CM | POA: Insufficient documentation

## 2014-02-10 DIAGNOSIS — IMO0002 Reserved for concepts with insufficient information to code with codable children: Secondary | ICD-10-CM | POA: Insufficient documentation

## 2014-02-10 DIAGNOSIS — M129 Arthropathy, unspecified: Secondary | ICD-10-CM | POA: Insufficient documentation

## 2014-02-10 DIAGNOSIS — J45909 Unspecified asthma, uncomplicated: Secondary | ICD-10-CM | POA: Diagnosis not present

## 2014-02-10 MED ORDER — IBUPROFEN 800 MG PO TABS: 800.0000 mg | ORAL_TABLET | Freq: Three times a day (TID) | ORAL | Status: DC

## 2014-02-10 NOTE — ED Notes (Signed)
Reports was shopping at [employer] and slipped in water and fell, landing on left hip- has spoken with manager at [employer] prior to coming to ED-

## 2014-02-10 NOTE — Discharge Instructions (Signed)
Contusion °A contusion is a deep bruise. Contusions are the result of an injury that caused bleeding under the skin. The contusion may turn blue, purple, or yellow. Minor injuries will give you a painless contusion, but more severe contusions may stay painful and swollen for a few weeks.  °CAUSES  °A contusion is usually caused by a blow, trauma, or direct force to an area of the body. °SYMPTOMS  °· Swelling and redness of the injured area. °· Bruising of the injured area. °· Tenderness and soreness of the injured area. °· Pain. °DIAGNOSIS  °The diagnosis can be made by taking a history and physical exam. An X-ray, CT scan, or MRI may be needed to determine if there were any associated injuries, such as fractures. °TREATMENT  °Specific treatment will depend on what area of the body was injured. In general, the best treatment for a contusion is resting, icing, elevating, and applying cold compresses to the injured area. Over-the-counter medicines may also be recommended for pain control. Ask your caregiver what the best treatment is for your contusion. °HOME CARE INSTRUCTIONS  °· Put ice on the injured area. °¨ Put ice in a plastic bag. °¨ Place a towel between your skin and the bag. °¨ Leave the ice on for 15-20 minutes, 3-4 times a day, or as directed by your health care provider. °· Only take over-the-counter or prescription medicines for pain, discomfort, or fever as directed by your caregiver. Your caregiver may recommend avoiding anti-inflammatory medicines (aspirin, ibuprofen, and naproxen) for 48 hours because these medicines may increase bruising. °· Rest the injured area. °· If possible, elevate the injured area to reduce swelling. °SEEK IMMEDIATE MEDICAL CARE IF:  °· You have increased bruising or swelling. °· You have pain that is getting worse. °· Your swelling or pain is not relieved with medicines. °MAKE SURE YOU:  °· Understand these instructions. °· Will watch your condition. °· Will get help right  away if you are not doing well or get worse. °Document Released: 02/16/2005 Document Revised: 05/14/2013 Document Reviewed: 03/14/2011 °[employer]® Patient Information ©2015 [employer]. This information is not intended to replace advice given to you by your health care provider. Make sure you discuss any questions you have with your health care provider. ° °

## 2014-02-10 NOTE — ED Provider Notes (Signed)
History/physical exam/procedure(s) were performed by non-physician practitioner and as supervising physician I was immediately available for consultation/collaboration. I have reviewed all notes and am in agreement with care and plan.   Hilario Quarry, MD 02/10/14 1520

## 2014-02-10 NOTE — ED Provider Notes (Signed)
CSN: [id number]     Arrival date & time 02/10/14  1211 History   First MD Initiated Contact with Patient 02/10/14 1243     Chief Complaint  Patient presents with  . Hip Pain  . Fall     (Consider location/radiation/quality/duration/timing/severity/associated sxs/prior Treatment) Patient is a 63 y.o. female presenting with hip pain and fall. The history is provided by the patient. No language interpreter was used.  Hip Pain This is a new problem. The current episode started today. The problem occurs constantly. The problem has been gradually worsening. Associated symptoms include arthralgias. Pertinent negatives include no abdominal pain. The symptoms are aggravated by walking. She has tried nothing for the symptoms. The treatment provided no relief.  Fall Associated symptoms include arthralgias. Pertinent negatives include no abdominal pain.  Pt reports she fell at [employer].   Pt complains of pain in left pelvic area  Past Medical History  Diagnosis Date  . Hypertension   . Asthma   . Environmental allergies   . Arthritis    Past Surgical History  Procedure Laterality Date  . Tonsillectomy    . Hand surgery  2004  . Cysts removal  1980    navel  . Tubal ligation     No family history on file. History  Substance Use Topics  . Smoking status: Current Every Day Smoker    Types: Cigarettes  . Smokeless tobacco: Never Used  . Alcohol Use: No   OB History   Grav Para Term Preterm Abortions TAB SAB Ect Mult Living                 Review of Systems  Gastrointestinal: Negative for abdominal pain.  Musculoskeletal: Positive for arthralgias.  All other systems reviewed and are negative.     Allergies  Amoxicillin-pot clavulanate  Home Medications   Prior to Admission medications   Medication Sig Start Date End Date Taking? Authorizing Provider  ALBUTEROL IN Inhale into the lungs.   Yes Historical Provider, MD  amLODipine (NORVASC) 5 MG tablet Take 5 mg by mouth  daily.   Yes Historical Provider, MD  fish oil-omega-3 fatty acids 1000 MG capsule Take 2 g by mouth daily.   Yes Historical Provider, MD  fluticasone (FLONASE) 50 MCG/ACT nasal spray Place 2 sprays into both nostrils daily. 12/01/13  Yes John L Molpus, MD  hydrALAZINE (APRESOLINE) 10 MG tablet Take 25 mg by mouth 3 (three) times daily.    Yes Historical Provider, MD  hydrochlorothiazide 25 MG tablet Take 25 mg by mouth daily.     Yes Historical Provider, MD  loratadine (CLARITIN) 10 MG tablet Take 1 tablet (10 mg total) by mouth daily. 10/20/12  Yes Geoffery Lyons, MD  meloxicam (MOBIC) 15 MG tablet Take 15 mg by mouth daily.     Yes Historical Provider, MD  potassium chloride SA (K-DUR,KLOR-CON) 20 MEQ tablet Take 20 mEq by mouth 2 (two) times daily.   Yes Historical Provider, MD  simvastatin (ZOCOR) 20 MG tablet Take 20 mg by mouth at bedtime.   Yes Historical Provider, MD  traMADol (ULTRAM-ER) 100 MG 24 hr tablet Take 100 mg by mouth daily.     Yes Historical Provider, MD  azithromycin (ZITHROMAX) 250 MG tablet Take 1 tablet (250 mg total) by mouth daily. Take first 2 tablets together, then 1 every day until finished. 10/20/12   Geoffery Lyons, MD  doxycycline (VIBRAMYCIN) 100 MG capsule Take 1 capsule (100 mg total) by mouth 2 (two) times  daily. One po bid x 7 days 12/01/13   Hanley Seamen, MD  HYDROcodone-acetaminophen (NORCO) 5-325 MG per tablet Take 2 tablets by mouth every 4 (four) hours as needed for pain. 10/20/12   Geoffery Lyons, MD  HYDROcodone-acetaminophen (NORCO/VICODIN) 5-325 MG per tablet Take 2 tablets by mouth every 4 (four) hours as needed for pain. 01/24/13   Elson Areas, PA-C  loratadine-pseudoephedrine (CLARITIN-D 12-HOUR) 5-120 MG per tablet Take 1 tablet by mouth daily.    Historical Provider, MD   BP 171/82  Pulse 51  Temp(Src) 98.1 F (36.7 C) (Oral)  Resp 20  Ht 5' (1.524 m)  Wt 148 lb (67.132 kg)  BMI 28.90 kg/m2  SpO2 100% Physical Exam  Nursing note and vitals  reviewed. Constitutional: She is oriented to person, place, and time. She appears well-developed and well-nourished.  HENT:  Head: Normocephalic and atraumatic.  Eyes: EOM are normal. Pupils are equal, round, and reactive to light.  Neck: Normal range of motion.  Pulmonary/Chest: Effort normal.  Abdominal: She exhibits no distension.  Musculoskeletal:  Tender left pelvic area posterior pelvic bone  Neurological: She is alert and oriented to person, place, and time.  Psychiatric: She has a normal mood and affect.    ED Course  Procedures (including critical care time) Labs Review Labs Reviewed - No data to display  Imaging Review Dg Pelvis 1-2 Views  02/10/2014   CLINICAL DATA:  Slipped and fell today with posterior left hip pain.  EXAM: PELVIS - 1-2 VIEW  COMPARISON:  None.  FINDINGS: No acute osseous or joint abnormality.  No degenerative changes.  IMPRESSION: Negative.   Electronically Signed   By: Leanna Battles M.D.   On: 02/10/2014 13:24     EKG Interpretation None      MDM   Final diagnoses:  Contusion    Ibuprofen Follow up with Dr. Pearletha Forge for recheck in 1 week if pain persist    Elson Areas, PA-C 02/10/14 1344

## 2016-02-14 ENCOUNTER — Encounter (HOSPITAL_BASED_OUTPATIENT_CLINIC_OR_DEPARTMENT_OTHER): Payer: Self-pay | Admitting: *Deleted

## 2016-02-14 ENCOUNTER — Emergency Department (HOSPITAL_BASED_OUTPATIENT_CLINIC_OR_DEPARTMENT_OTHER)
Admission: EM | Admit: 2016-02-14 | Discharge: 2016-02-14 | Disposition: A | Discharge status: Home / Self Care | Payer: Worker's Compensation | Attending: Emergency Medicine | Admitting: Emergency Medicine

## 2016-02-14 DIAGNOSIS — Y990 Civilian activity done for income or pay: Secondary | ICD-10-CM | POA: Insufficient documentation

## 2016-02-14 DIAGNOSIS — Y939 Activity, unspecified: Secondary | ICD-10-CM | POA: Insufficient documentation

## 2016-02-14 DIAGNOSIS — S60561A Insect bite (nonvenomous) of right hand, initial encounter: Secondary | ICD-10-CM | POA: Diagnosis present

## 2016-02-14 DIAGNOSIS — F17210 Nicotine dependence, cigarettes, uncomplicated: Secondary | ICD-10-CM | POA: Diagnosis not present

## 2016-02-14 DIAGNOSIS — L03113 Cellulitis of right upper limb: Secondary | ICD-10-CM | POA: Diagnosis not present

## 2016-02-14 DIAGNOSIS — Z79899 Other long term (current) drug therapy: Secondary | ICD-10-CM | POA: Insufficient documentation

## 2016-02-14 DIAGNOSIS — W57XXXA Bitten or stung by nonvenomous insect and other nonvenomous arthropods, initial encounter: Secondary | ICD-10-CM | POA: Insufficient documentation

## 2016-02-14 DIAGNOSIS — I10 Essential (primary) hypertension: Secondary | ICD-10-CM | POA: Diagnosis not present

## 2016-02-14 DIAGNOSIS — J45909 Unspecified asthma, uncomplicated: Secondary | ICD-10-CM | POA: Diagnosis not present

## 2016-02-14 DIAGNOSIS — Y929 Unspecified place or not applicable: Secondary | ICD-10-CM | POA: Insufficient documentation

## 2016-02-14 MED ORDER — CLINDAMYCIN HCL 300 MG PO CAPS: 300.0000 mg | ORAL_CAPSULE | Freq: Three times a day (TID) | ORAL | 0 refills | Status: DC

## 2016-02-14 MED ORDER — CLINDAMYCIN HCL 150 MG PO CAPS
300.0000 mg | ORAL_CAPSULE | Freq: Once | ORAL | Status: AC
  Administered 2016-02-14: 300 mg via ORAL
  Filled 2016-02-14: qty 2

## 2016-02-14 NOTE — ED Notes (Signed)
MD with pt  

## 2016-02-14 NOTE — ED Triage Notes (Signed)
Pt states she was at work and got bit by something "felt it, but didn't see it". Right hand has mild swelling to posterior aspect between index and middle finger.

## 2016-02-14 NOTE — ED Provider Notes (Signed)
[facility] Provider Note   CSN: [id number] Arrival date & time: 02/14/16  0203     History   Chief Complaint Chief Complaint  Patient presents with  . Insect Bite    HPI Kathy Branch is a 65 y.o. female with no significant past medical history presenting today for blood bite on her right hand. Patient states she was at work something bit her and she immediately handed off. She did not see what it was. She then developed hand swelling, redness, and pain. She presents here for further evaluation. She denies any other symptoms at this time. There are no further complaints.  10 Systems reviewed and are negative for acute change except as noted in the HPI.    HPI  Past Medical History:  Diagnosis Date  . Arthritis   . Asthma   . Environmental allergies   . Hypertension     There are no active problems to display for this patient.   Past Surgical History:  Procedure Laterality Date  . cysts removal  1980   navel  . HAND SURGERY  2004  . TONSILLECTOMY    . TUBAL LIGATION      OB History    No data available       Home Medications    Prior to Admission medications   Medication Sig Start Date End Date Taking? Authorizing Provider  ALBUTEROL IN Inhale into the lungs.    Historical Provider, MD  amLODipine (NORVASC) 5 MG tablet Take 5 mg by mouth daily.    Historical Provider, MD  azithromycin (ZITHROMAX) 250 MG tablet Take 1 tablet (250 mg total) by mouth daily. Take first 2 tablets together, then 1 every day until finished. 10/20/12   Geoffery Lyons, MD  clindamycin (CLEOCIN) 300 MG capsule Take 1 capsule (300 mg total) by mouth 3 (three) times daily. X 7 days 02/14/16   Tomasita Crumble, MD  doxycycline (VIBRAMYCIN) 100 MG capsule Take 1 capsule (100 mg total) by mouth 2 (two) times daily. One po bid x 7 days 12/01/13   Paula Libra, MD  fish oil-omega-3 fatty acids 1000 MG capsule Take 2 g by mouth daily.    Historical Provider, MD  fluticasone (FLONASE) 50  MCG/ACT nasal spray Place 2 sprays into both nostrils daily. 12/01/13   John Molpus, MD  hydrALAZINE (APRESOLINE) 10 MG tablet Take 25 mg by mouth 3 (three) times daily.     Historical Provider, MD  hydrochlorothiazide 25 MG tablet Take 25 mg by mouth daily.      Historical Provider, MD  HYDROcodone-acetaminophen (NORCO) 5-325 MG per tablet Take 2 tablets by mouth every 4 (four) hours as needed for pain. 10/20/12   Geoffery Lyons, MD  HYDROcodone-acetaminophen (NORCO/VICODIN) 5-325 MG per tablet Take 2 tablets by mouth every 4 (four) hours as needed for pain. 01/24/13   Elson Areas, PA-C  ibuprofen (ADVIL,MOTRIN) 800 MG tablet Take 1 tablet (800 mg total) by mouth 3 (three) times daily. 02/10/14   Elson Areas, PA-C  loratadine (CLARITIN) 10 MG tablet Take 1 tablet (10 mg total) by mouth daily. 10/20/12   Geoffery Lyons, MD  loratadine-pseudoephedrine (CLARITIN-D 12-HOUR) 5-120 MG per tablet Take 1 tablet by mouth daily.    Historical Provider, MD  meloxicam (MOBIC) 15 MG tablet Take 15 mg by mouth daily.      Historical Provider, MD  potassium chloride SA (K-DUR,KLOR-CON) 20 MEQ tablet Take 20 mEq by mouth 2 (two) times daily.  Historical Provider, MD  simvastatin (ZOCOR) 20 MG tablet Take 20 mg by mouth at bedtime.    Historical Provider, MD  traMADol (ULTRAM-ER) 100 MG 24 hr tablet Take 100 mg by mouth daily.      Historical Provider, MD    Family History History reviewed. No pertinent family history.  Social History Social History  Substance Use Topics  . Smoking status: Current Every Day Smoker    Types: Cigarettes  . Smokeless tobacco: Never Used  . Alcohol use No     Allergies   Amoxicillin-pot clavulanate   Review of Systems Review of Systems   Physical Exam Updated Vital Signs BP 139/67 (BP Location: Right Arm)   Pulse 60   Temp 98.2 F (36.8 C) (Oral)   Resp 18   Ht 5' (1.524 m)   Wt 153 lb (69.4 kg)   SpO2 98%   BMI 29.88 kg/m   Physical Exam  Constitutional:  She is oriented to person, place, and time. She appears well-developed and well-nourished. No distress.  HENT:  Head: Normocephalic and atraumatic.  Nose: Nose normal.  Mouth/Throat: Oropharynx is clear and moist. No oropharyngeal exudate.  Eyes: Conjunctivae and EOM are normal. Pupils are equal, round, and reactive to light. No scleral icterus.  Neck: Normal range of motion. Neck supple. No JVD present. No tracheal deviation present. No thyromegaly present.  Cardiovascular: Normal rate, regular rhythm and normal heart sounds.  Exam reveals no gallop and no friction rub.   No murmur heard. Pulmonary/Chest: Effort normal and breath sounds normal. No respiratory distress. She has no wheezes. She exhibits no tenderness.  Abdominal: Soft. Bowel sounds are normal. She exhibits no distension and no mass. There is no tenderness. There is no rebound and no guarding.  Musculoskeletal: Normal range of motion. She exhibits no edema or tenderness.  Lymphadenopathy:    She has no cervical adenopathy.  Neurological: She is alert and oriented to person, place, and time. No cranial nerve deficit. She exhibits normal muscle tone.  Skin: Skin is warm and dry. No rash noted. There is erythema. No pallor.  Dorsum of the right hand has a 2 cm circumferential area of erythema, warmth, and tenderness to palpation. Patient has full range of motion of her hand. There is no abscess formation palpated.  Nursing note and vitals reviewed.    ED Treatments / Results  Labs (all labs ordered are listed, but only abnormal results are displayed) Labs Reviewed - No data to display  EKG  EKG Interpretation None       Radiology No results found.  Procedures Procedures (including critical care time)  Medications Ordered in ED Medications  clindamycin (CLEOCIN) capsule 300 mg (not administered)     Initial Impression / Assessment and Plan / ED Course  I have reviewed the triage vital signs and the nursing  notes.  Pertinent labs & imaging results that were available during my care of the patient were reviewed by me and considered in my medical decision making (see chart for details).  Clinical Course    Patient presents to emergency department after a bug bite. The area on her hand is consistent with possible cellulitis versus just a local reaction to the bug bite. Because it is the hand infection can spread rapidly, will treat with clindamycin. Primary care follow-up advised within 3 days for wound check. Patient demonstrates good understanding of the plan. She appears well and in no acute distress, vital signs were within her normal limits and  she is safe for discharge. First dose of clindamycin given in the emergency department.  Final Clinical Impressions(s) / ED Diagnoses   Final diagnoses:  Cellulitis of right upper extremity    New Prescriptions New Prescriptions   CLINDAMYCIN (CLEOCIN) 300 MG CAPSULE    Take 1 capsule (300 mg total) by mouth 3 (three) times daily. X 7 days     Tomasita CrumbleAdeleke Carita Sollars, MD 02/14/16 [id number]

## 2018-08-07 ENCOUNTER — Emergency Department (HOSPITAL_BASED_OUTPATIENT_CLINIC_OR_DEPARTMENT_OTHER)
Admission: EM | Admit: 2018-08-07 | Discharge: 2018-08-07 | Disposition: A | Discharge status: Home / Self Care | Payer: BLUE CROSS/BLUE SHIELD | Attending: Emergency Medicine | Admitting: Emergency Medicine

## 2018-08-07 ENCOUNTER — Encounter (HOSPITAL_BASED_OUTPATIENT_CLINIC_OR_DEPARTMENT_OTHER): Payer: Self-pay | Admitting: Emergency Medicine

## 2018-08-07 ENCOUNTER — Emergency Department (HOSPITAL_BASED_OUTPATIENT_CLINIC_OR_DEPARTMENT_OTHER): Payer: BLUE CROSS/BLUE SHIELD

## 2018-08-07 ENCOUNTER — Other Ambulatory Visit: Payer: Self-pay

## 2018-08-07 DIAGNOSIS — F17210 Nicotine dependence, cigarettes, uncomplicated: Secondary | ICD-10-CM | POA: Insufficient documentation

## 2018-08-07 DIAGNOSIS — B9789 Other viral agents as the cause of diseases classified elsewhere: Secondary | ICD-10-CM | POA: Diagnosis not present

## 2018-08-07 DIAGNOSIS — J069 Acute upper respiratory infection, unspecified: Secondary | ICD-10-CM | POA: Diagnosis not present

## 2018-08-07 DIAGNOSIS — R05 Cough: Secondary | ICD-10-CM | POA: Diagnosis present

## 2018-08-07 DIAGNOSIS — J45909 Unspecified asthma, uncomplicated: Secondary | ICD-10-CM | POA: Insufficient documentation

## 2018-08-07 DIAGNOSIS — J40 Bronchitis, not specified as acute or chronic: Secondary | ICD-10-CM

## 2018-08-07 LAB — CBC WITH DIFFERENTIAL/PLATELET
Abs Immature Granulocytes: 0.02 10*3/uL (ref 0.00–0.07)
Basophils Absolute: 0 10*3/uL (ref 0.0–0.1)
Basophils Relative: 0 %
Eosinophils Absolute: 0.1 10*3/uL (ref 0.0–0.5)
Eosinophils Relative: 2 %
HCT: 42.6 % (ref 36.0–46.0)
Hemoglobin: 13.1 g/dL (ref 12.0–15.0)
Immature Granulocytes: 0 %
Lymphocytes Relative: 52 %
Lymphs Abs: 2.8 10*3/uL (ref 0.7–4.0)
MCH: 25.9 pg — ABNORMAL LOW (ref 26.0–34.0)
MCHC: 30.8 g/dL (ref 30.0–36.0)
MCV: 84.4 fL (ref 80.0–100.0)
Monocytes Absolute: 0.5 10*3/uL (ref 0.1–1.0)
Monocytes Relative: 9 %
Neutro Abs: 2 10*3/uL (ref 1.7–7.7)
Neutrophils Relative %: 37 %
Platelets: 207 10*3/uL (ref 150–400)
RBC: 5.05 MIL/uL (ref 3.87–5.11)
RDW: 13.8 % (ref 11.5–15.5)
WBC: 5.4 10*3/uL (ref 4.0–10.5)
nRBC: 0 % (ref 0.0–0.2)

## 2018-08-07 LAB — BASIC METABOLIC PANEL
Anion gap: 5 (ref 5–15)
BUN: 15 mg/dL (ref 8–23)
CO2: 26 mmol/L (ref 22–32)
Calcium: 9.1 mg/dL (ref 8.9–10.3)
Chloride: 107 mmol/L (ref 98–111)
Creatinine, Ser: 0.72 mg/dL (ref 0.44–1.00)
GFR calc Af Amer: >60 mL/min (ref >60)
GFR calc non Af Amer: >60 mL/min (ref >60)
Glucose, Bld: 114 mg/dL — ABNORMAL HIGH (ref 70–99)
Potassium: 3.3 mmol/L — ABNORMAL LOW (ref 3.5–5.1)
Sodium: 138 mmol/L (ref 135–145)

## 2018-08-07 MED ORDER — ALBUTEROL SULFATE HFA 108 (90 BASE) MCG/ACT IN AERS: 2.0000 | INHALATION_SPRAY | Freq: Four times a day (QID) | RESPIRATORY_TRACT | Status: DC

## 2018-08-07 MED ORDER — ALBUTEROL SULFATE HFA 108 (90 BASE) MCG/ACT IN AERS
2.0000 | INHALATION_SPRAY | Freq: Once | RESPIRATORY_TRACT | Status: AC
  Administered 2018-08-07: 2 via RESPIRATORY_TRACT
  Filled 2018-08-07: qty 6.7

## 2018-08-07 MED ORDER — GUAIFENESIN ER 1200 MG PO TB12: 1.0000 | ORAL_TABLET | Freq: Two times a day (BID) | ORAL | 0 refills | Status: DC

## 2018-08-07 MED ORDER — PREDNISONE 50 MG PO TABS: 50.0000 mg | ORAL_TABLET | Freq: Every day | ORAL | 0 refills | Status: DC

## 2018-08-07 MED ORDER — AEROCHAMBER PLUS FLO-VU MEDIUM MISC
1.0000 | Freq: Once | Status: DC
  Filled 2018-08-07: qty 1

## 2018-08-07 MED ORDER — PROMETHAZINE-DM 6.25-15 MG/5ML PO SYRP: 5.0000 mL | ORAL_SOLUTION | Freq: Four times a day (QID) | ORAL | 0 refills | Status: DC | PRN

## 2018-08-07 MED ORDER — IPRATROPIUM-ALBUTEROL 0.5-2.5 (3) MG/3ML IN SOLN: 3.0000 mL | Freq: Once | RESPIRATORY_TRACT | Status: DC

## 2018-08-07 NOTE — ED Notes (Signed)
Pt c/o nasal drainage and a cough that started today at work and was sent by her employer, [employer], to be checked out. Pt has a hx of seasonal allergies and is currently taking any oral medication for such. She denies a fever, has not had any antipyretics today, denies any known exposure to COVID or anyone else that has been sick.

## 2018-08-07 NOTE — Discharge Instructions (Addendum)
Return here as needed.  Your chest x-ray did not show any abnormalities.  Follow-up with your doctor.

## 2018-08-07 NOTE — ED Triage Notes (Signed)
Pt c/o persistent cough, fever, chills, SOB and headache since yesterday getting worse today.

## 2018-08-10 NOTE — ED Provider Notes (Signed)
[facility] EMERGENCY DEPARTMENT Provider Note   CSN: [id number] Arrival date & time: 08/07/18  2127    History   Chief Complaint Chief Complaint  Patient presents with  . Shortness of Breath    HPI Kathy Branch is a 68 y.o. female.     HPI Patient presents to the emergency department with nasal drainage and cough that started today while she was at work.  The patient was sent in by her employer.  Patient states that she does have seasonal allergies which she has been taking some medications.  Patient states that she has had no other symptoms at this time.  The patient states that nothing seems to make the condition better or worse.  The patient states she feels pretty well otherwise.  The patient denies chest pain, shortness of breath, headache,blurred vision, neck pain, cough, weakness, numbness, dizziness, anorexia, edema, abdominal pain, nausea, vomiting, diarrhea, rash, back pain, dysuria, hematemesis, bloody stool, near syncope, or syncope. Past Medical History:  Diagnosis Date  . Arthritis   . Asthma   . Environmental allergies   . Hypertension     There are no active problems to display for this patient.   Past Surgical History:  Procedure Laterality Date  . cysts removal  1980   navel  . HAND SURGERY  2004  . TONSILLECTOMY    . TUBAL LIGATION       OB History   No obstetric history on file.      Home Medications    Prior to Admission medications   Medication Sig Start Date End Date Taking? Authorizing Provider  ALBUTEROL IN Inhale into the lungs.    [provider]  amLODipine (NORVASC) 5 MG tablet Take 5 mg by mouth daily.    [provider]  azithromycin (ZITHROMAX) 250 MG tablet Take 1 tablet (250 mg total) by mouth daily. Take first 2 tablets together, then 1 every day until finished. 10/20/12   Geoffery Lyonselo, Douglas, MD  clindamycin (CLEOCIN) 300 MG capsule Take 1 capsule (300 mg total) by mouth 3 (three) times daily. X 7  days 02/14/16   Tomasita Crumbleni, Adeleke, MD  doxycycline (VIBRAMYCIN) 100 MG capsule Take 1 capsule (100 mg total) by mouth 2 (two) times daily. One po bid x 7 days 12/01/13   Molpus, John, MD  fish oil-omega-3 fatty acids 1000 MG capsule Take 2 g by mouth daily.    [provider]  fluticasone (FLONASE) 50 MCG/ACT nasal spray Place 2 sprays into both nostrils daily. 12/01/13   Molpus, John, MD  Guaifenesin 1200 MG TB12 Take 1 tablet (1,200 mg total) by mouth 2 (two) times daily. 08/07/18   Jari Carollo, Cristal Deerhristopher, PA-C  hydrALAZINE (APRESOLINE) 10 MG tablet Take 25 mg by mouth 3 (three) times daily.     [provider]  hydrochlorothiazide 25 MG tablet Take 25 mg by mouth daily.      [provider]  HYDROcodone-acetaminophen (NORCO) 5-325 MG per tablet Take 2 tablets by mouth every 4 (four) hours as needed for pain. 10/20/12   Geoffery Lyonselo, Douglas, MD  HYDROcodone-acetaminophen (NORCO/VICODIN) 5-325 MG per tablet Take 2 tablets by mouth every 4 (four) hours as needed for pain. 01/24/13   Elson AreasSofia, Leslie K, PA-C  ibuprofen (ADVIL,MOTRIN) 800 MG tablet Take 1 tablet (800 mg total) by mouth 3 (three) times daily. 02/10/14   Elson AreasSofia, Leslie K, PA-C  loratadine (CLARITIN) 10 MG tablet Take 1 tablet (10 mg total) by mouth daily. 10/20/12   Delo,  Riley Lam, MD  loratadine-pseudoephedrine (CLARITIN-D 12-HOUR) 5-120 MG per tablet Take 1 tablet by mouth daily.    [provider]  meloxicam (MOBIC) 15 MG tablet Take 15 mg by mouth daily.      [provider]  potassium chloride SA (K-DUR,KLOR-CON) 20 MEQ tablet Take 20 mEq by mouth 2 (two) times daily.    [provider]  predniSONE (DELTASONE) 50 MG tablet Take 1 tablet (50 mg total) by mouth daily. 08/07/18   Cherlynn Popiel, Cristal Deer, PA-C  promethazine-dextromethorphan (PROMETHAZINE-DM) 6.25-15 MG/5ML syrup Take 5 mLs by mouth 4 (four) times daily as needed for cough. 08/07/18   Jalyn Rosero, Cristal Deer, PA-C  simvastatin (ZOCOR) 20 MG tablet Take 20  mg by mouth at bedtime.    [provider]  traMADol (ULTRAM-ER) 100 MG 24 hr tablet Take 100 mg by mouth daily.      [provider]    Family History No family history on file.  Social History Social History   Tobacco Use  . Smoking status: Current Every Day Smoker    Types: Cigarettes  . Smokeless tobacco: Never Used  Substance Use Topics  . Alcohol use: No  . Drug use: No     Allergies   Amoxicillin-pot clavulanate   Review of Systems Review of Systems All other systems negative except as documented in the HPI. All pertinent positives and negatives as reviewed in the HPI.  Physical Exam Updated Vital Signs BP 132/80 (BP Location: Left Arm)   Pulse 88   Temp 98.2 F (36.8 C)   Resp 18   Ht 5' (1.524 m)   Wt 71.7 kg   SpO2 100%   BMI 30.86 kg/m   Physical Exam Vitals signs and nursing note reviewed.  Constitutional:      General: She is not in acute distress.    Appearance: She is well-developed.  HENT:     Head: Normocephalic and atraumatic.  Eyes:     Pupils: Pupils are equal, round, and reactive to light.  Neck:     Musculoskeletal: Normal range of motion and neck supple.  Cardiovascular:     Rate and Rhythm: Normal rate and regular rhythm.     Heart sounds: Normal heart sounds. No murmur. No friction rub. No gallop.   Pulmonary:     Effort: Pulmonary effort is normal. No respiratory distress.     Breath sounds: Normal breath sounds. No decreased breath sounds, wheezing, rhonchi or rales.  Abdominal:     General: Bowel sounds are normal. There is no distension.     Palpations: Abdomen is soft.     Tenderness: There is no abdominal tenderness.  Musculoskeletal:     Right lower leg: No edema.     Left lower leg: No edema.  Skin:    General: Skin is warm and dry.     Capillary Refill: Capillary refill takes less than 2 seconds.     Findings: No erythema or rash.  Neurological:     Mental Status: She is alert and oriented to  person, place, and time.     Motor: No abnormal muscle tone.     Coordination: Coordination normal.  Psychiatric:        Behavior: Behavior normal.      ED Treatments / Results  Labs (all labs ordered are listed, but only abnormal results are displayed) Labs Reviewed  BASIC METABOLIC PANEL - Abnormal; Notable for the following components:      Result Value   Potassium 3.3 (*)  Glucose, Bld 114 (*)    All other components within normal limits  CBC WITH DIFFERENTIAL/PLATELET - Abnormal; Notable for the following components:   MCH 25.9 (*)    All other components within normal limits    EKG None  Radiology No results found.  Procedures Procedures (including critical care time)  Medications Ordered in ED Medications  albuterol (PROVENTIL HFA;VENTOLIN HFA) 108 (90 Base) MCG/ACT inhaler 2 puff (2 puffs Inhalation Given 08/07/18 2242)     Initial Impression / Assessment and Plan / ED Course  I have reviewed the triage vital signs and the nursing notes.  Pertinent labs & imaging results that were available during my care of the patient were reviewed by me and considered in my medical decision making (see chart for details).        Feel that the patient most likely has a viral URI with cough.  She does not have any fevers or other significant features with this.  There is a nursing note that mentions that she has had fever headache cough.  The patient has not had fever or headache.  She is only had cough with nasal congestion and drainage.  Final Clinical Impressions(s) / ED Diagnoses   Final diagnoses:  Viral URI with cough  Bronchitis    ED Discharge Orders         Ordered    Guaifenesin 1200 MG TB12  2 times daily     08/07/18 2305    predniSONE (DELTASONE) 50 MG tablet  Daily     08/07/18 2305    promethazine-dextromethorphan (PROMETHAZINE-DM) 6.25-15 MG/5ML syrup  4 times daily PRN     08/07/18 2305           Charlestine Night, PA-C 08/10/18 0610     Long, Arlyss Repress, MD 08/10/18 [phone]

## 2018-10-11 ENCOUNTER — Other Ambulatory Visit: Payer: Self-pay

## 2018-10-11 ENCOUNTER — Emergency Department (HOSPITAL_BASED_OUTPATIENT_CLINIC_OR_DEPARTMENT_OTHER)
Admission: EM | Admit: 2018-10-11 | Discharge: 2018-10-11 | Disposition: A | Discharge status: Home / Self Care | Payer: BLUE CROSS/BLUE SHIELD | Attending: Emergency Medicine | Admitting: Emergency Medicine

## 2018-10-11 ENCOUNTER — Encounter (HOSPITAL_BASED_OUTPATIENT_CLINIC_OR_DEPARTMENT_OTHER): Payer: Self-pay

## 2018-10-11 DIAGNOSIS — I10 Essential (primary) hypertension: Secondary | ICD-10-CM | POA: Insufficient documentation

## 2018-10-11 DIAGNOSIS — J4521 Mild intermittent asthma with (acute) exacerbation: Secondary | ICD-10-CM | POA: Insufficient documentation

## 2018-10-11 DIAGNOSIS — F17210 Nicotine dependence, cigarettes, uncomplicated: Secondary | ICD-10-CM | POA: Diagnosis not present

## 2018-10-11 DIAGNOSIS — R062 Wheezing: Secondary | ICD-10-CM | POA: Diagnosis present

## 2018-10-11 DIAGNOSIS — Z79899 Other long term (current) drug therapy: Secondary | ICD-10-CM | POA: Insufficient documentation

## 2018-10-11 MED ORDER — ALBUTEROL SULFATE HFA 108 (90 BASE) MCG/ACT IN AERS: 1.0000 | INHALATION_SPRAY | Freq: Four times a day (QID) | RESPIRATORY_TRACT | 0 refills | Status: AC | PRN

## 2018-10-11 MED ORDER — METHYLPREDNISOLONE SODIUM SUCC 125 MG IJ SOLR
125.0000 mg | Freq: Once | INTRAMUSCULAR | Status: AC
  Administered 2018-10-11: 125 mg via INTRAMUSCULAR

## 2018-10-11 MED ORDER — PREDNISONE 10 MG PO TABS: 40.0000 mg | ORAL_TABLET | Freq: Every day | ORAL | 0 refills | Status: AC

## 2018-10-11 MED ORDER — METHYLPREDNISOLONE SODIUM SUCC 125 MG IJ SOLR
125.0000 mg | Freq: Once | INTRAMUSCULAR | Status: DC
  Filled 2018-10-11: qty 2

## 2018-10-11 NOTE — ED Triage Notes (Signed)
Pt c/o "astham/wheezing? Day 2-last used albuterol inhaler/neb PTA-pt NAD-steady gait-RT in for assessment prior to triage

## 2018-10-11 NOTE — Discharge Instructions (Addendum)
Use your inhaler every 4 hours as needed. Thank you for allowing me to care for you today. Please return to the emergency department if you have new or worsening symptoms. Take your medications as instructed.

## 2018-10-12 NOTE — ED Provider Notes (Signed)
[facility] EMERGENCY DEPARTMENT Provider Note   CSN: [id number] Arrival date & time: 10/11/18  1916    History   Chief Complaint Chief Complaint  Patient presents with  . Wheezing    HPI Kathy Branch is a 68 y.o. female.     68 y/o F with PMH asthma presents to the ER for wheezing. Reports that her asthma has been flaring up for about 1 week. She uses albuterol inhaler once daily. Reports that she recently moved to an area where there are more environmental irritants of her asthma as well. Denies taking any over the counter allergy pills due to them causing sleepiness. Denies cough, fever, chills, sick contacts, SOB, chest pain     Past Medical History:  Diagnosis Date  . Arthritis   . Asthma   . Environmental allergies   . Hypertension     There are no active problems to display for this patient.   Past Surgical History:  Procedure Laterality Date  . cysts removal  1980   navel  . HAND SURGERY  2004  . TONSILLECTOMY    . TUBAL LIGATION       OB History   No obstetric history on file.      Home Medications    Prior to Admission medications   Medication Sig Start Date End Date Taking? Authorizing Provider  albuterol (VENTOLIN HFA) 108 (90 Base) MCG/ACT inhaler Inhale 1-2 puffs into the lungs every 6 (six) hours as needed for wheezing or shortness of breath. 10/11/18   Ronnie Doss A, PA-C  ALBUTEROL IN Inhale into the lungs.    [provider]  amLODipine (NORVASC) 5 MG tablet Take 5 mg by mouth daily.    [provider]  azithromycin (ZITHROMAX) 250 MG tablet Take 1 tablet (250 mg total) by mouth daily. Take first 2 tablets together, then 1 every day until finished. 10/20/12   Geoffery Lyons, MD  clindamycin (CLEOCIN) 300 MG capsule Take 1 capsule (300 mg total) by mouth 3 (three) times daily. X 7 days 02/14/16   Tomasita Crumble, MD  doxycycline (VIBRAMYCIN) 100 MG capsule Take 1 capsule (100 mg total) by mouth 2 (two) times  daily. One po bid x 7 days 12/01/13   Molpus, John, MD  fish oil-omega-3 fatty acids 1000 MG capsule Take 2 g by mouth daily.    [provider]  fluticasone (FLONASE) 50 MCG/ACT nasal spray Place 2 sprays into both nostrils daily. 12/01/13   Molpus, John, MD  Guaifenesin 1200 MG TB12 Take 1 tablet (1,200 mg total) by mouth 2 (two) times daily. 08/07/18   Lawyer, Cristal Deer, PA-C  hydrALAZINE (APRESOLINE) 10 MG tablet Take 25 mg by mouth 3 (three) times daily.     [provider]  hydrochlorothiazide 25 MG tablet Take 25 mg by mouth daily.      [provider]  HYDROcodone-acetaminophen (NORCO) 5-325 MG per tablet Take 2 tablets by mouth every 4 (four) hours as needed for pain. 10/20/12   Geoffery Lyons, MD  HYDROcodone-acetaminophen (NORCO/VICODIN) 5-325 MG per tablet Take 2 tablets by mouth every 4 (four) hours as needed for pain. 01/24/13   Elson Areas, PA-C  ibuprofen (ADVIL,MOTRIN) 800 MG tablet Take 1 tablet (800 mg total) by mouth 3 (three) times daily. 02/10/14   Elson Areas, PA-C  loratadine (CLARITIN) 10 MG tablet Take 1 tablet (10 mg total) by mouth daily. 10/20/12   Geoffery Lyons, MD  loratadine-pseudoephedrine (CLARITIN-D 12-HOUR) 5-120 MG per  tablet Take 1 tablet by mouth daily.    [provider]  meloxicam (MOBIC) 15 MG tablet Take 15 mg by mouth daily.      [provider]  potassium chloride SA (K-DUR,KLOR-CON) 20 MEQ tablet Take 20 mEq by mouth 2 (two) times daily.    [provider]  predniSONE (DELTASONE) 10 MG tablet Take 4 tablets (40 mg total) by mouth daily for 5 days. 10/11/18 10/16/18  Arlyn Dunning, PA-C  promethazine-dextromethorphan (PROMETHAZINE-DM) 6.25-15 MG/5ML syrup Take 5 mLs by mouth 4 (four) times daily as needed for cough. 08/07/18   Lawyer, Cristal Deer, PA-C  simvastatin (ZOCOR) 20 MG tablet Take 20 mg by mouth at bedtime.    [provider]  traMADol (ULTRAM-ER) 100 MG 24 hr tablet Take 100 mg by  mouth daily.      [provider]    Family History No family history on file.  Social History Social History   Tobacco Use  . Smoking status: Current Every Day Smoker    Types: Cigarettes  . Smokeless tobacco: Never Used  Substance Use Topics  . Alcohol use: No  . Drug use: No     Allergies   Patient has no known allergies.   Review of Systems Review of Systems  Constitutional: Negative for appetite change, chills and fever.  HENT: Negative for congestion, sneezing and sore throat.   Eyes: Negative for itching.  Respiratory: Positive for cough and wheezing. Negative for apnea, chest tightness and shortness of breath.   Cardiovascular: Negative for chest pain, palpitations and leg swelling.  Gastrointestinal: Negative for abdominal pain, diarrhea, nausea and vomiting.  Genitourinary: Negative for dysuria.  Musculoskeletal: Negative for myalgias.  Skin: Negative for rash.  Neurological: Negative for dizziness and light-headedness.     Physical Exam Updated Vital Signs BP (!) 170/82 (BP Location: Right Arm)   Pulse 94   Temp 98.3 F (36.8 C) (Oral)   Resp (!) 22   SpO2 100%   Physical Exam Vitals signs and nursing note reviewed.  Constitutional:      General: She is not in acute distress.    Appearance: Normal appearance. She is normal weight. She is not ill-appearing, toxic-appearing or diaphoretic.  HENT:     Head: Normocephalic.     Nose: Nose normal.     Mouth/Throat:     Mouth: Mucous membranes are moist.  Eyes:     Conjunctiva/sclera: Conjunctivae normal.  Cardiovascular:     Rate and Rhythm: Normal rate and regular rhythm.     Pulses: Normal pulses.  Pulmonary:     Effort: Pulmonary effort is normal.     Breath sounds: No rhonchi.     Comments: Scattered bilateral high pitch wheezes  Chest:     Chest wall: No tenderness.  Neurological:     General: No focal deficit present.     Mental Status: She is alert.  Psychiatric:        Mood  and Affect: Mood normal.      ED Treatments / Results  Labs (all labs ordered are listed, but only abnormal results are displayed) Labs Reviewed - No data to display  EKG None  Radiology No results found.  Procedures Procedures (including critical care time)  Medications Ordered in ED Medications  methylPREDNISolone sodium succinate (SOLU-MEDROL) 125 mg/2 mL injection 125 mg (125 mg Intramuscular Given 10/11/18 2042)     Initial Impression / Assessment and Plan / ED Course  I have reviewed the triage  vital signs and the nursing notes.  Pertinent labs & imaging results that were available during my care of the patient were reviewed by me and considered in my medical decision making (see chart for details).  Clinical Course as of Oct 11 725  Fri Oct 12, 2018  [id number] Patient appears to have mild asthma exacerbation, likely in the context of new environmental irritants. Advised to start using inhaler every 4 hours. Will give short dose of prednisone. Also advised to start OTC antihistamine medication as well.    [KM]    Clinical Course User Index [KM] Arlyn DunningMcLean, Larren Copes A, PA-C       Based on review of vitals, medical screening exam, lab work and/or imaging, there does not appear to be an acute, emergent etiology for the patient's symptoms. Counseled pt on good return precautions and encouraged both PCP and ED follow-up as needed.  Prior to discharge, I also discussed incidental imaging findings with patient in detail and advised appropriate, recommended follow-up in detail.  Clinical Impression: 1. Mild intermittent asthma with exacerbation     Disposition: Discharge  Prior to providing a prescription for a controlled substance, I independently reviewed the patient's recent prescription history on the [place] Controlled Substance Reporting System. The patient had no recent or regular prescriptions and was deemed appropriate for a brief, less than 3 day prescription of  narcotic for acute analgesia.  This note was prepared with assistance of Conservation officer, historic buildingsDragon voice recognition software. Occasional wrong-word or sound-a-like substitutions may have occurred due to the inherent limitations of voice recognition software.   Final Clinical Impressions(s) / ED Diagnoses   Final diagnoses:  Mild intermittent asthma with exacerbation    ED Discharge Orders         Ordered    albuterol (VENTOLIN HFA) 108 (90 Base) MCG/ACT inhaler  Every 6 hours PRN     10/11/18 2033    predniSONE (DELTASONE) 10 MG tablet  Daily     10/11/18 2033           Arlyn DunningMcLean, Saqib Cazarez A, PA-C 10/12/18 [id number]    Arby BarrettePfeiffer, Marcy, MD 10/17/18 [phone]

## 2019-05-09 ENCOUNTER — Emergency Department (HOSPITAL_BASED_OUTPATIENT_CLINIC_OR_DEPARTMENT_OTHER)
Admission: EM | Admit: 2019-05-09 | Discharge: 2019-05-09 | Disposition: A | Discharge status: Home / Self Care | Payer: Medicare Other | Attending: Emergency Medicine | Admitting: Emergency Medicine

## 2019-05-09 ENCOUNTER — Other Ambulatory Visit: Payer: Self-pay

## 2019-05-09 ENCOUNTER — Encounter (HOSPITAL_BASED_OUTPATIENT_CLINIC_OR_DEPARTMENT_OTHER): Payer: Self-pay

## 2019-05-09 DIAGNOSIS — I10 Essential (primary) hypertension: Secondary | ICD-10-CM | POA: Diagnosis not present

## 2019-05-09 DIAGNOSIS — J45909 Unspecified asthma, uncomplicated: Secondary | ICD-10-CM | POA: Diagnosis not present

## 2019-05-09 DIAGNOSIS — R0602 Shortness of breath: Secondary | ICD-10-CM | POA: Diagnosis present

## 2019-05-09 DIAGNOSIS — J209 Acute bronchitis, unspecified: Secondary | ICD-10-CM | POA: Diagnosis not present

## 2019-05-09 DIAGNOSIS — Z79899 Other long term (current) drug therapy: Secondary | ICD-10-CM | POA: Diagnosis not present

## 2019-05-09 DIAGNOSIS — R05 Cough: Secondary | ICD-10-CM | POA: Diagnosis not present

## 2019-05-09 DIAGNOSIS — Z87891 Personal history of nicotine dependence: Secondary | ICD-10-CM | POA: Insufficient documentation

## 2019-05-09 MED ORDER — DEXAMETHASONE 4 MG PO TABS: ORAL_TABLET | ORAL | 0 refills | Status: AC

## 2019-05-09 MED ORDER — HYDROCOD POLST-CPM POLST ER 10-8 MG/5ML PO SUER: 5.0000 mL | Freq: Two times a day (BID) | ORAL | 0 refills | Status: AC | PRN

## 2019-05-09 MED ORDER — ALBUTEROL SULFATE HFA 108 (90 BASE) MCG/ACT IN AERS
8.0000 | INHALATION_SPRAY | Freq: Once | RESPIRATORY_TRACT | Status: AC
  Administered 2019-05-09: 8 via RESPIRATORY_TRACT
  Filled 2019-05-09: qty 6.7

## 2019-05-09 NOTE — ED Triage Notes (Signed)
Pt c/o prod cough and wheezing-minimal relief with albuterol inhaler-NAD-steady gait

## 2019-05-09 NOTE — ED Provider Notes (Signed)
[facility] Provider Note: Kathy Dell, MD, FACEP  CSN: [id number] MRN: [id number] ARRIVAL: 05/09/19 at 2205 ROOM: [facility]08/[facility]08   CHIEF COMPLAINT  Cough   HISTORY OF PRESENT ILLNESS  05/09/19 10:52 PM Kathy Branch is a 68 y.o. female with a history of asthma.  She is here with a 2 to 3-day history of an asthma exacerbation.  She has had an increased shortness of breath and increased wheezing.  Symptoms are worse when lying supine and better when sitting upright.  She has been using her inhaler without adequate relief.  She is not currently on steroids.  She denies fever, loss of taste or smell, or other COVID-19 suggestive symptoms.  She also has a dry cough with this.  Symptoms are moderate.   Past Medical History:  Diagnosis Date  . Arthritis   . Asthma   . Environmental allergies   . Hypertension     Past Surgical History:  Procedure Laterality Date  . cysts removal  1980   navel  . HAND SURGERY  2004  . TONSILLECTOMY    . TUBAL LIGATION      No family history on file.  Social History   Tobacco Use  . Smoking status: Former Smoker    Types: Cigarettes  . Smokeless tobacco: Never Used  Substance Use Topics  . Alcohol use: No  . Drug use: No    Prior to Admission medications   Medication Sig Start Date End Date Taking? Authorizing Provider  albuterol (VENTOLIN HFA) 108 (90 Base) MCG/ACT inhaler Inhale 1-2 puffs into the lungs every 6 (six) hours as needed for wheezing or shortness of breath. 10/11/18   Ronnie Doss A, PA-C  amLODipine (NORVASC) 5 MG tablet Take 5 mg by mouth daily.    [provider]  chlorpheniramine-HYDROcodone (TUSSIONEX PENNKINETIC ER) 10-8 MG/5ML SUER Take 5 mLs by mouth every 12 (twelve) hours as needed. 05/09/19   Corley Kohls, MD  dexamethasone (DECADRON) 4 MG tablet Take 2.5 tablets (10 mg) today and tomorrow. 05/09/19   Maleeyah Mccaughey, MD  fish oil-omega-3 fatty acids 1000 MG capsule Take 2 g by mouth daily.     [provider]  hydrALAZINE (APRESOLINE) 10 MG tablet Take 25 mg by mouth 3 (three) times daily.     [provider]  hydrochlorothiazide 25 MG tablet Take 25 mg by mouth daily.      [provider]  meloxicam (MOBIC) 15 MG tablet Take 15 mg by mouth daily.      [provider]  potassium chloride SA (K-DUR,KLOR-CON) 20 MEQ tablet Take 20 mEq by mouth 2 (two) times daily.    [provider]  simvastatin (ZOCOR) 20 MG tablet Take 20 mg by mouth at bedtime.    [provider]  traMADol (ULTRAM-ER) 100 MG 24 hr tablet Take 100 mg by mouth daily.      [provider]  ALBUTEROL IN Inhale into the lungs.  05/09/19  [provider]  fluticasone (FLONASE) 50 MCG/ACT nasal spray Place 2 sprays into both nostrils daily. 12/01/13 05/09/19  Pattricia Weiher, Jonny Ruiz, MD    Allergies Patient has no known allergies.   REVIEW OF SYSTEMS  Negative except as noted here or in the History of Present Illness.   PHYSICAL EXAMINATION  Initial Vital Signs Blood pressure (!) 172/89, pulse 75, temperature 98.7 F (37.1 C), temperature source Oral, resp. rate 18, height 5\' 1"  (1.549 m), weight 73 kg, SpO2 96 %.  Examination General:  Well-developed, well-nourished female in no acute distress; appearance consistent with age of record HENT: normocephalic; atraumatic Eyes: pupils equal, round and reactive to light; extraocular muscles intact; arcus senilis bilaterally Neck: supple Heart: regular rate and rhythm Lungs: Basilar wheezes, expiratory greater than inspiratory Abdomen: soft; nondistended; nontender; bowel sounds present Extremities: No deformity; full range of motion; pulses normal Neurologic: Awake, alert and oriented; motor function intact in all extremities and symmetric; no facial droop Skin: Warm and dry Psychiatric: Normal mood and affect   RESULTS  Summary of this visit's results, reviewed and interpreted by myself:   EKG  Interpretation  Date/Time:    Ventricular Rate:    PR Interval:    QRS Duration:   QT Interval:    QTC Calculation:   R Axis:     Text Interpretation:        Laboratory Studies: No results found for this or any previous visit (from the past 24 hour(s)). Imaging Studies: No results found.  ED COURSE and MDM  Nursing notes, initial and subsequent vitals signs, including pulse oximetry, reviewed and interpreted by myself.  Vitals:   05/09/19 2211 05/09/19 2212 05/09/19 2224 05/09/19 2313  BP:  (!) 172/89    Pulse:  75    Resp:  18    Temp:  98.7 F (37.1 C)    TempSrc:  Oral    SpO2:  95% 96% 96%  Weight: 73 kg     Height: 5\' 1"  (1.549 m)      Medications  albuterol (VENTOLIN HFA) 108 (90 Base) MCG/ACT inhaler 8 puff (8 puffs Inhalation Given 05/09/19 2308)   We will place patient on steroid taper and Tussionex.  PROCEDURES  Procedures   ED DIAGNOSES     ICD-10-CM   1. Acute bronchitis with bronchospasm  J20.9        Chanah Tidmore, MD 05/09/19 2314

## 2022-03-28 ENCOUNTER — Other Ambulatory Visit: Payer: Self-pay

## 2022-03-28 ENCOUNTER — Encounter (HOSPITAL_BASED_OUTPATIENT_CLINIC_OR_DEPARTMENT_OTHER): Payer: Self-pay | Admitting: Emergency Medicine

## 2022-03-28 DIAGNOSIS — E876 Hypokalemia: Secondary | ICD-10-CM | POA: Diagnosis not present

## 2022-03-28 DIAGNOSIS — I10 Essential (primary) hypertension: Secondary | ICD-10-CM | POA: Insufficient documentation

## 2022-03-28 DIAGNOSIS — R1030 Lower abdominal pain, unspecified: Secondary | ICD-10-CM | POA: Diagnosis present

## 2022-03-28 DIAGNOSIS — J45909 Unspecified asthma, uncomplicated: Secondary | ICD-10-CM | POA: Diagnosis not present

## 2022-03-28 DIAGNOSIS — Z79899 Other long term (current) drug therapy: Secondary | ICD-10-CM | POA: Diagnosis not present

## 2022-03-28 LAB — CBC WITH DIFFERENTIAL/PLATELET
Abs Immature Granulocytes: 0.02 10*3/uL (ref 0.00–0.07)
Basophils Absolute: 0 10*3/uL (ref 0.0–0.1)
Basophils Relative: 1 %
Eosinophils Absolute: 0.1 10*3/uL (ref 0.0–0.5)
Eosinophils Relative: 2 %
HCT: 41.1 % (ref 36.0–46.0)
Hemoglobin: 13.2 g/dL (ref 12.0–15.0)
Immature Granulocytes: 0 %
Lymphocytes Relative: 46 %
Lymphs Abs: 2.9 10*3/uL (ref 0.7–4.0)
MCH: 26.3 pg (ref 26.0–34.0)
MCHC: 32.1 g/dL (ref 30.0–36.0)
MCV: 81.9 fL (ref 80.0–100.0)
Monocytes Absolute: 0.5 10*3/uL (ref 0.1–1.0)
Monocytes Relative: 8 %
Neutro Abs: 2.8 10*3/uL (ref 1.7–7.7)
Neutrophils Relative %: 43 %
Platelets: 305 10*3/uL (ref 150–400)
RBC: 5.02 MIL/uL (ref 3.87–5.11)
RDW: 13.5 % (ref 11.5–15.5)
WBC: 6.4 10*3/uL (ref 4.0–10.5)
nRBC: 0 % (ref 0.0–0.2)

## 2022-03-28 LAB — URINALYSIS, ROUTINE W REFLEX MICROSCOPIC
Bilirubin Urine: NEGATIVE
Glucose, UA: NEGATIVE mg/dL
Hgb urine dipstick: NEGATIVE
Ketones, ur: NEGATIVE mg/dL
Leukocytes,Ua: NEGATIVE
Nitrite: NEGATIVE
Protein, ur: NEGATIVE mg/dL
Specific Gravity, Urine: 1.025 (ref 1.005–1.030)
pH: 6.5 (ref 5.0–8.0)

## 2022-03-28 LAB — COMPREHENSIVE METABOLIC PANEL
ALT: 15 U/L (ref 0–44)
AST: 16 U/L (ref 15–41)
Albumin: 3.9 g/dL (ref 3.5–5.0)
Alkaline Phosphatase: 81 U/L (ref 38–126)
Anion gap: 9 (ref 5–15)
BUN: 16 mg/dL (ref 8–23)
CO2: 27 mmol/L (ref 22–32)
Calcium: 8.9 mg/dL (ref 8.9–10.3)
Chloride: 103 mmol/L (ref 98–111)
Creatinine, Ser: 0.79 mg/dL (ref 0.44–1.00)
GFR, Estimated: >60 mL/min (ref >60)
Glucose, Bld: 110 mg/dL — ABNORMAL HIGH (ref 70–99)
Potassium: 2.8 mmol/L — ABNORMAL LOW (ref 3.5–5.1)
Sodium: 139 mmol/L (ref 135–145)
Total Bilirubin: 0.3 mg/dL (ref 0.3–1.2)
Total Protein: 7.4 g/dL (ref 6.5–8.1)

## 2022-03-28 LAB — LIPASE, BLOOD: Lipase: 27 U/L (ref 11–51)

## 2022-03-28 NOTE — ED Triage Notes (Signed)
Lower abdominal pain x 1 week. Reports pain worsens after eating. Endorses possible constipation. Morning nausea and decreased appetite. LBM today. Denies vaginal issues, urinary sx. Sx unresolved with laxities.

## 2022-03-29 ENCOUNTER — Emergency Department (HOSPITAL_BASED_OUTPATIENT_CLINIC_OR_DEPARTMENT_OTHER): Payer: Medicare (Managed Care)

## 2022-03-29 ENCOUNTER — Emergency Department (HOSPITAL_BASED_OUTPATIENT_CLINIC_OR_DEPARTMENT_OTHER)
Admission: EM | Admit: 2022-03-29 | Discharge: 2022-03-29 | Disposition: A | Discharge status: Home / Self Care | Payer: Medicare (Managed Care) | Attending: Emergency Medicine | Admitting: Emergency Medicine

## 2022-03-29 DIAGNOSIS — R109 Unspecified abdominal pain: Secondary | ICD-10-CM

## 2022-03-29 DIAGNOSIS — E876 Hypokalemia: Secondary | ICD-10-CM

## 2022-03-29 MED ORDER — IOHEXOL 300 MG/ML  SOLN
100.0000 mL | Freq: Once | INTRAMUSCULAR | Status: AC | PRN
  Administered 2022-03-29: 100 mL via INTRAVENOUS

## 2022-03-29 MED ORDER — POTASSIUM CHLORIDE 10 MEQ/100ML IV SOLN
10.0000 meq | Freq: Once | INTRAVENOUS | Status: AC
  Administered 2022-03-29: 10 meq via INTRAVENOUS
  Filled 2022-03-29: qty 100

## 2022-03-29 MED ORDER — SODIUM CHLORIDE 0.9 % IV BOLUS
1000.0000 mL | Freq: Once | INTRAVENOUS | Status: AC
  Administered 2022-03-29: 1000 mL via INTRAVENOUS

## 2022-03-29 MED ORDER — POTASSIUM CHLORIDE CRYS ER 20 MEQ PO TBCR
40.0000 meq | EXTENDED_RELEASE_TABLET | Freq: Once | ORAL | Status: AC
  Administered 2022-03-29: 40 meq via ORAL
  Filled 2022-03-29: qty 2

## 2022-03-29 NOTE — ED Provider Notes (Addendum)
[facility] EMERGENCY DEPARTMENT Provider Note   CSN: [id number] Arrival date & time: 03/28/22  1811     History  Chief Complaint  Patient presents with   Abdominal Pain    LOWER    Kathy Branch is a 71 y.o. female.  Patient is a 71 year old female with past medical history of asthma, hypertension, hyperlipidemia.  Patient presenting with lower abdominal pain.  This has been intermittent and worsening over the past week.  Pain seems to come on when she eats.  She denies any fevers or chills.  She denies any urinary complaints.  She does report having issues with constipation and needs to use a suppository to have a good bowel movement.  She has no prior abdominal surgery and reports a normal colonoscopy 9 years ago.  The history is provided by the patient.       Home Medications Prior to Admission medications   Medication Sig Start Date End Date Taking? Authorizing Provider  albuterol (VENTOLIN HFA) 108 (90 Base) MCG/ACT inhaler Inhale 1-2 puffs into the lungs every 6 (six) hours as needed for wheezing or shortness of breath. 10/11/18   Ronnie Doss A, PA-C  amLODipine (NORVASC) 5 MG tablet Take 5 mg by mouth daily.    [provider]  chlorpheniramine-HYDROcodone (TUSSIONEX PENNKINETIC ER) 10-8 MG/5ML SUER Take 5 mLs by mouth every 12 (twelve) hours as needed. 05/09/19   Molpus, John, MD  dexamethasone (DECADRON) 4 MG tablet Take 2.5 tablets (10 mg) today and tomorrow. 05/09/19   Molpus, John, MD  fish oil-omega-3 fatty acids 1000 MG capsule Take 2 g by mouth daily.    [provider]  hydrALAZINE (APRESOLINE) 10 MG tablet Take 25 mg by mouth 3 (three) times daily.     [provider]  hydrochlorothiazide 25 MG tablet Take 25 mg by mouth daily.      [provider]  meloxicam (MOBIC) 15 MG tablet Take 15 mg by mouth daily.      [provider]  potassium chloride SA (K-DUR,KLOR-CON) 20 MEQ tablet Take 20 mEq by mouth 2  (two) times daily.    [provider]  simvastatin (ZOCOR) 20 MG tablet Take 20 mg by mouth at bedtime.    [provider]  traMADol (ULTRAM-ER) 100 MG 24 hr tablet Take 100 mg by mouth daily.      [provider]  ALBUTEROL IN Inhale into the lungs.  05/09/19  [provider]  fluticasone (FLONASE) 50 MCG/ACT nasal spray Place 2 sprays into both nostrils daily. 12/01/13 05/09/19  Molpus, Jonny Ruiz, MD      Allergies    Patient has no known allergies.    Review of Systems   Review of Systems  All other systems reviewed and are negative.   Physical Exam Updated Vital Signs BP (!) 166/76   Pulse 78   Temp 98.7 F (37.1 C)   Resp 18   Ht 5' (1.524 m)   Wt 69.9 kg   SpO2 100%   BMI 30.08 kg/m  Physical Exam Vitals and nursing note reviewed.  Constitutional:      General: She is not in acute distress.    Appearance: She is well-developed. She is not diaphoretic.  HENT:     Head: Normocephalic and atraumatic.  Cardiovascular:     Rate and Rhythm: Normal rate and regular rhythm.     Heart sounds: No murmur heard.    No friction rub. No gallop.  Pulmonary:  Effort: Pulmonary effort is normal. No respiratory distress.     Breath sounds: Normal breath sounds. No wheezing.  Abdominal:     General: Bowel sounds are normal. There is no distension.     Palpations: Abdomen is soft.     Tenderness: There is abdominal tenderness in the right lower quadrant, suprapubic area and left lower quadrant. There is no right CVA tenderness, left CVA tenderness, guarding or rebound.  Musculoskeletal:        General: Normal range of motion.     Cervical back: Normal range of motion and neck supple.  Skin:    General: Skin is warm and dry.  Neurological:     General: No focal deficit present.     Mental Status: She is alert and oriented to person, place, and time.     ED Results / Procedures / Treatments   Labs (all labs ordered are listed, but only  abnormal results are displayed) Labs Reviewed  COMPREHENSIVE METABOLIC PANEL - Abnormal; Notable for the following components:      Result Value   Potassium 2.8 (*)    Glucose, Bld 110 (*)    All other components within normal limits  LIPASE, BLOOD  URINALYSIS, ROUTINE W REFLEX MICROSCOPIC  CBC WITH DIFFERENTIAL/PLATELET    EKG None  Radiology No results found.  Procedures Procedures    Medications Ordered in ED Medications  sodium chloride 0.9 % bolus 1,000 mL (has no administration in time range)    ED Course/ Medical Decision Making/ A&P  Patient is a 71 year old female presenting with complaints of lower abdominal pain as described in the HPI.  She arrives here afebrile with stable vital signs she has mild tenderness across the lower abdomen, but no peritoneal signs.  Physical exam otherwise unremarkable.  Work-up initiated in triage including CBC, metabolic panel, and urinalysis.  All of these are unremarkable with the exception of a potassium of 2.8 which she tells me is normal for her.  She has no white count and urinalysis not consistent with UTI.  Potassium was replaced here in the ER both intravenously and orally.  CT scan ordered showing no evidence for diverticulitis or other emergent cause.  She does have a thickened endometrium, the etiology of which I am uncertain, but ultrasound has been recommended on a nonemergent basis.  I feel as though patient can safely be discharged.  I will advise fiber supplementation as she does describe some constipation.  Final Clinical Impression(s) / ED Diagnoses Final diagnoses:  None    Rx / DC Orders ED Discharge Orders     None         Veryl Speak, MD 03/29/22 0151    Veryl Speak, MD 03/29/22 [phone]

## 2022-03-29 NOTE — Discharge Instructions (Addendum)
Begin using Metamucil, 1 heaping teaspoon in a glass of water twice daily.  Follow-up with primary doctor if symptoms are not improving in the next week, and return to the ER if you develop worsening pain, high fever, bloody stools, or other new and concerning symptoms.  The radiologist is recommending an outpatient ultrasound to further evaluate your uterus.  Please schedule this through your primary doctor.

## 2023-11-26 ENCOUNTER — Encounter (HOSPITAL_BASED_OUTPATIENT_CLINIC_OR_DEPARTMENT_OTHER): Payer: Self-pay

## 2023-11-26 ENCOUNTER — Emergency Department (HOSPITAL_BASED_OUTPATIENT_CLINIC_OR_DEPARTMENT_OTHER)
Admission: EM | Admit: 2023-11-26 | Discharge: 2023-11-26 | Disposition: A | Discharge status: Home / Self Care | Attending: Emergency Medicine | Admitting: Emergency Medicine

## 2023-11-26 ENCOUNTER — Other Ambulatory Visit: Payer: Self-pay

## 2023-11-26 DIAGNOSIS — T783XXA Angioneurotic edema, initial encounter: Secondary | ICD-10-CM | POA: Diagnosis not present

## 2023-11-26 DIAGNOSIS — K148 Other diseases of tongue: Secondary | ICD-10-CM | POA: Diagnosis present

## 2023-11-26 HISTORY — DX: Type 2 diabetes mellitus without complications: E11.9

## 2023-11-26 HISTORY — DX: Hyperlipidemia, unspecified: E78.5

## 2023-11-26 MED ORDER — PREDNISONE 20 MG PO TABS: ORAL_TABLET | ORAL | 0 refills | Status: AC

## 2023-11-26 MED ORDER — PREDNISONE 50 MG PO TABS
60.0000 mg | ORAL_TABLET | Freq: Once | ORAL | Status: AC
  Administered 2023-11-26: 60 mg via ORAL
  Filled 2023-11-26: qty 1

## 2023-11-26 MED ORDER — DIPHENHYDRAMINE HCL 50 MG/ML IJ SOLN
25.0000 mg | Freq: Once | INTRAMUSCULAR | Status: AC
  Administered 2023-11-26: 25 mg via INTRAVENOUS
  Filled 2023-11-26: qty 1

## 2023-11-26 MED ORDER — FAMOTIDINE IN NACL 20-0.9 MG/50ML-% IV SOLN
20.0000 mg | Freq: Once | INTRAVENOUS | Status: AC
  Administered 2023-11-26: 20 mg via INTRAVENOUS
  Filled 2023-11-26: qty 50

## 2023-11-26 MED ORDER — METHYLPREDNISOLONE SODIUM SUCC 125 MG IJ SOLR
125.0000 mg | Freq: Once | INTRAMUSCULAR | Status: AC
  Administered 2023-11-26: 125 mg via INTRAVENOUS
  Filled 2023-11-26: qty 2

## 2023-11-26 NOTE — Discharge Instructions (Addendum)
 1.  Lab has been added to your test today that will not be completed before you leave the emergency department.  This is called C1 esterase inhibitor.  This is a test to look for hereditary angioedema.  This is a condition whereby a genetic disorder causes episodes of swelling.  You said that your sisters also experience similar episode.  Your allergist would be the appropriate doctor to follow-up with regarding the results of this test and any other further testing needed.  Call them to schedule a recheck as soon as possible.  If you have this condition, you should have special medications available to use if needed emergently. 2.  Continue prednisone  as prescribed.  Take Benadryl  every 6 hours for the next 24 hours. 3.  Return to Emergency Department immediately if you get swelling in your mouth or throat again. You do not appear to be on medications that have high risk to cause angioedema.  You should however avoid medications such as ibuprofen  (Motrin ), meloxicam (Mobic), Aleve (naproxen).  If you have any question if the medication is an NSAID (nonsteroidal anti-inflammatory) ask the pharmacist or your doctor before taking it.

## 2023-11-26 NOTE — ED Provider Notes (Signed)
 [place] EMERGENCY DEPARTMENT AT [facility] Provider Note   CSN: [id number] Arrival date & time: 11/26/23  1703     Patient presents with: Oral Swelling   Kathy Branch is a 73 y.o. female.   HPI Patient reports started getting swelling of the right side of her tongue about 2 hours ago.  She reports it happened right after she ate a frozen meal that was in [place] of chicken and broccoli.  Patient reports that she had an episode of tongue swelling about 5 months ago.  At that time she reports it started during the night and at first half of her tongue swelled and then her lips swelled.  She reports that time her tongue swelled quite a bit and it muffled her voice.  She went to the hospital and got treatment.  She reports she was told to stop taking her blood pressure medications but her blood pressure started elevating so she did resume taking them and has been fairly consistently taking them now since that time.  I have reviewed all the patient's medications and she never had an ACE inhibitor or ARB on her meds list or in her pharmacy fill record.    Prior to Admission medications   Medication Sig Start Date End Date Taking? Authorizing Provider  predniSONE  (DELTASONE ) 20 MG tablet 2 tabs po daily x 3 days 11/26/23  Yes Armenta Canning, MD  albuterol  (VENTOLIN  HFA) 108 (90 Base) MCG/ACT inhaler Inhale 1-2 puffs into the lungs every 6 (six) hours as needed for wheezing or shortness of breath. 10/11/18   Rolan Sor A, PA-C  amLODipine (NORVASC) 5 MG tablet Take 5 mg by mouth daily.    [provider]  chlorpheniramine-HYDROcodone  (TUSSIONEX PENNKINETIC ER) 10-8 MG/5ML SUER Take 5 mLs by mouth every 12 (twelve) hours as needed. 05/09/19   Molpus, John, MD  dexamethasone  (DECADRON ) 4 MG tablet Take 2.5 tablets (10 mg) today and tomorrow. 05/09/19   Molpus, John, MD  fish oil-omega-3 fatty acids 1000 MG capsule Take 2 g by mouth daily.    [provider]   hydrALAZINE (APRESOLINE) 10 MG tablet Take 25 mg by mouth 3 (three) times daily.     [provider]  hydrochlorothiazide 25 MG tablet Take 25 mg by mouth daily.      [provider]  meloxicam (MOBIC) 15 MG tablet Take 15 mg by mouth daily.      [provider]  potassium chloride  SA (K-DUR,KLOR-CON ) 20 MEQ tablet Take 20 mEq by mouth 2 (two) times daily.    [provider]  simvastatin (ZOCOR) 20 MG tablet Take 20 mg by mouth at bedtime.    [provider]  traMADol  (ULTRAM -ER) 100 MG 24 hr tablet Take 100 mg by mouth daily.      [provider]  ALBUTEROL  IN Inhale into the lungs.  05/09/19  [provider]  fluticasone  (FLONASE ) 50 MCG/ACT nasal spray Place 2 sprays into both nostrils daily. 12/01/13 05/09/19  Molpus, Norleen, MD    Allergies: Patient has no known allergies.    Review of Systems  Updated Vital Signs BP 124/65 (BP Location: Right Arm)   Pulse 73   Temp 98.2 F (36.8 C) (Oral)   Resp (!) 22   Ht 5' (1.524 m)   Wt 69.9 kg   SpO2 98%   BMI 30.08 kg/m   Physical Exam Constitutional:      Comments: The patient is alert.  Mental status clear.  No respiratory distress.  HENT:     Head: Normocephalic and atraumatic.     Nose: Nose normal.     Mouth/Throat:     Comments: Patient has about 20 to 30% swelling of the right side of the tongue.  The posterior oropharynx is widely patent.  No perioral swelling. Eyes:     Extraocular Movements: Extraocular movements intact.     Conjunctiva/sclera: Conjunctivae normal.     Pupils: Pupils are equal, round, and reactive to light.  Cardiovascular:     Rate and Rhythm: Normal rate and regular rhythm.  Pulmonary:     Effort: Pulmonary effort is normal.     Breath sounds: Normal breath sounds.  Abdominal:     General: There is no distension.     Palpations: Abdomen is soft.     Tenderness: There is no abdominal tenderness.  Musculoskeletal:        General:  Normal range of motion.     Cervical back: Neck supple.  Skin:    General: Skin is warm and dry.  Neurological:     General: No focal deficit present.     Mental Status: She is oriented to person, place, and time.     Motor: No weakness.     Coordination: Coordination normal.  Psychiatric:        Mood and Affect: Mood normal.     (all labs ordered are listed, but only abnormal results are displayed) Labs Reviewed  C1 ESTERASE INHIBITOR    EKG: None  Radiology: No results found.   Procedures   Medications Ordered in the ED  predniSONE  (DELTASONE ) tablet 60 mg (has no administration in time range)  diphenhydrAMINE  (BENADRYL ) injection 25 mg (25 mg Intravenous Given 11/26/23 1743)  methylPREDNISolone  sodium succinate (SOLU-MEDROL ) 125 mg/2 mL injection 125 mg (125 mg Intravenous Given 11/26/23 1743)  famotidine  (PEPCID ) IVPB 20 mg premix (0 mg Intravenous Stopped 11/26/23 1826)                                    Medical Decision Making Amount and/or Complexity of Data Reviewed Labs: ordered.  Risk Prescription drug management.   Patient presents as outlined.  She reports she has had 2 episodes of angioedema previously.  Last one was about 5 or 6 months ago.  She showed me some images that had significantly more swelling of the tongue and the lip.  Today this came on quite quickly after eating a frozen meal.  By review, patient does not appear to be on any blood pressure medications or other medications that would be high risk for angioedema.  She is treated with Solu-Medrol , Benadryl  and Pepcid .  After recheck, patient started to get improvement with decreasing swelling of the tongue.  After several rechecks this is much improved and has not progressed.  Patient reports that 2 of her sisters also get similar episodes.  She reports he never had a specific diagnosis.  Given this familial pattern, I have added C1 esterase inhibitor and recommended the patient follow back up with  her allergist to rule out possibility of hereditary angioedema.  I have also counseled her on avoiding any NSAIDs and at this point do not see other medications that are highly likely to cause angioedema.  She is aware of the need to return immediately if she develops any oral swelling.     Final diagnoses:  Angioedema, initial encounter  ED Discharge Orders          Ordered    predniSONE  (DELTASONE ) 20 MG tablet        11/26/23 2156               Armenta Canning, MD 11/26/23 2159

## 2023-11-26 NOTE — ED Notes (Signed)
 Dr Armenta informed of swelling to tongue

## 2023-11-26 NOTE — ED Notes (Signed)
 Swelling noted to right side of tongue x . ? Causes, denies SOB or hives. No distress noted

## 2023-11-26 NOTE — ED Triage Notes (Signed)
 C/o swelling to right side of tongue x 1 hour. Denies difficulty breathing or swallowing.

## 2023-11-29 LAB — C1 ESTERASE INHIBITOR: C1INH SerPl-mCnc: 50 mg/dL — ABNORMAL HIGH (ref 21–39)
